# Patient Record
Sex: Female | Born: 1975 | Race: Black or African American | Hispanic: No | Smoking: Never smoker
Health system: Southern US, Community
[De-identification: ages and names within clinical notes are randomized; demographics above are authoritative.]

## PROBLEM LIST (undated history)

## (undated) DIAGNOSIS — Z87442 Personal history of urinary calculi: Secondary | ICD-10-CM

## (undated) DIAGNOSIS — F4321 Adjustment disorder with depressed mood: Secondary | ICD-10-CM

## (undated) DIAGNOSIS — I10 Essential (primary) hypertension: Secondary | ICD-10-CM

## (undated) DIAGNOSIS — F419 Anxiety disorder, unspecified: Secondary | ICD-10-CM

## (undated) DIAGNOSIS — N3001 Acute cystitis with hematuria: Secondary | ICD-10-CM

## (undated) HISTORY — PX: LIPOMA EXCISION: SHX5283

---

## 2008-02-24 HISTORY — PX: BREAST BIOPSY: SHX20

## 2019-03-20 ENCOUNTER — Other Ambulatory Visit: Payer: Self-pay | Admitting: Family Medicine

## 2019-03-20 DIAGNOSIS — Z1231 Encounter for screening mammogram for malignant neoplasm of breast: Secondary | ICD-10-CM

## 2019-04-27 ENCOUNTER — Ambulatory Visit
Admission: RE | Admit: 2019-04-27 | Discharge: 2019-04-27 | Disposition: A | Discharge status: Home / Self Care | Payer: Medicaid Other | Source: Ambulatory Visit | Attending: Family Medicine | Admitting: Family Medicine

## 2019-04-27 DIAGNOSIS — Z1231 Encounter for screening mammogram for malignant neoplasm of breast: Secondary | ICD-10-CM | POA: Insufficient documentation

## 2019-11-13 ENCOUNTER — Ambulatory Visit (HOSPITAL_COMMUNITY)
Admission: RE | Admit: 2019-11-13 | Discharge: 2019-11-13 | Disposition: A | Discharge status: Home / Self Care | Payer: Medicaid Other | Source: Ambulatory Visit | Attending: Pulmonary Disease | Admitting: Pulmonary Disease

## 2019-11-13 ENCOUNTER — Telehealth (HOSPITAL_COMMUNITY): Payer: Self-pay | Admitting: Nurse Practitioner

## 2019-11-13 ENCOUNTER — Emergency Department: Payer: Medicaid Other

## 2019-11-13 ENCOUNTER — Other Ambulatory Visit (HOSPITAL_COMMUNITY): Payer: Self-pay | Admitting: Nurse Practitioner

## 2019-11-13 ENCOUNTER — Other Ambulatory Visit: Payer: Self-pay

## 2019-11-13 ENCOUNTER — Encounter: Payer: Self-pay | Admitting: Emergency Medicine

## 2019-11-13 ENCOUNTER — Emergency Department
Admission: EM | Admit: 2019-11-13 | Discharge: 2019-11-13 | Disposition: A | Discharge status: Home / Self Care | Payer: Medicaid Other | Attending: Emergency Medicine | Admitting: Emergency Medicine

## 2019-11-13 DIAGNOSIS — U071 COVID-19: Secondary | ICD-10-CM

## 2019-11-13 DIAGNOSIS — R05 Cough: Secondary | ICD-10-CM | POA: Diagnosis present

## 2019-11-13 LAB — URINALYSIS, COMPLETE (UACMP) WITH MICROSCOPIC
Bacteria, UA: NONE SEEN
Bilirubin Urine: NEGATIVE
Glucose, UA: NEGATIVE mg/dL
Hgb urine dipstick: NEGATIVE
Ketones, ur: NEGATIVE mg/dL
Leukocytes,Ua: NEGATIVE
Nitrite: NEGATIVE
Protein, ur: 100 mg/dL — AB
Specific Gravity, Urine: 1.025 (ref 1.005–1.030)
pH: 5 (ref 5.0–8.0)

## 2019-11-13 LAB — CBC WITH DIFFERENTIAL/PLATELET
Abs Immature Granulocytes: 0.01 10*3/uL (ref 0.00–0.07)
Basophils Absolute: 0 10*3/uL (ref 0.0–0.1)
Basophils Relative: 0 %
Eosinophils Absolute: 0 10*3/uL (ref 0.0–0.5)
Eosinophils Relative: 0 %
HCT: 41 % (ref 36.0–46.0)
Hemoglobin: 13.4 g/dL (ref 12.0–15.0)
Immature Granulocytes: 0 %
Lymphocytes Relative: 21 %
Lymphs Abs: 0.8 10*3/uL (ref 0.7–4.0)
MCH: 28.9 pg (ref 26.0–34.0)
MCHC: 32.7 g/dL (ref 30.0–36.0)
MCV: 88.6 fL (ref 80.0–100.0)
Monocytes Absolute: 0.2 10*3/uL (ref 0.1–1.0)
Monocytes Relative: 6 %
Neutro Abs: 2.7 10*3/uL (ref 1.7–7.7)
Neutrophils Relative %: 73 %
Platelets: 109 10*3/uL — ABNORMAL LOW (ref 150–400)
RBC: 4.63 MIL/uL (ref 3.87–5.11)
RDW: 14.1 % (ref 11.5–15.5)
WBC: 3.7 10*3/uL — ABNORMAL LOW (ref 4.0–10.5)
nRBC: 0 % (ref 0.0–0.2)

## 2019-11-13 LAB — COMPREHENSIVE METABOLIC PANEL
ALT: 51 U/L — ABNORMAL HIGH (ref 0–44)
AST: 46 U/L — ABNORMAL HIGH (ref 15–41)
Albumin: 3.6 g/dL (ref 3.5–5.0)
Alkaline Phosphatase: 107 U/L (ref 38–126)
Anion gap: 13 (ref 5–15)
BUN: 11 mg/dL (ref 6–20)
CO2: 26 mmol/L (ref 22–32)
Calcium: 8.6 mg/dL — ABNORMAL LOW (ref 8.9–10.3)
Chloride: 97 mmol/L — ABNORMAL LOW (ref 98–111)
Creatinine, Ser: 0.73 mg/dL (ref 0.44–1.00)
GFR calc Af Amer: >60 mL/min (ref >60)
GFR calc non Af Amer: >60 mL/min (ref >60)
Glucose, Bld: 106 mg/dL — ABNORMAL HIGH (ref 70–99)
Potassium: 4.1 mmol/L (ref 3.5–5.1)
Sodium: 136 mmol/L (ref 135–145)
Total Bilirubin: 0.7 mg/dL (ref 0.3–1.2)
Total Protein: 7.3 g/dL (ref 6.5–8.1)

## 2019-11-13 LAB — POCT PREGNANCY, URINE: Preg Test, Ur: NEGATIVE

## 2019-11-13 LAB — LACTIC ACID, PLASMA: Lactic Acid, Venous: 0.9 mmol/L (ref 0.5–1.9)

## 2019-11-13 MED ORDER — ACETAMINOPHEN 325 MG PO TABS
ORAL_TABLET | ORAL | Status: AC
  Filled 2019-11-13: qty 2

## 2019-11-13 MED ORDER — DEXAMETHASONE SODIUM PHOSPHATE 10 MG/ML IJ SOLN
10.0000 mg | Freq: Once | INTRAMUSCULAR | Status: AC
  Administered 2019-11-13: 10 mg via INTRAVENOUS
  Filled 2019-11-13: qty 1

## 2019-11-13 MED ORDER — SODIUM CHLORIDE 0.9 % IV SOLN: INTRAVENOUS | Status: DC | PRN

## 2019-11-13 MED ORDER — SODIUM CHLORIDE 0.9 % IV BOLUS
1000.0000 mL | Freq: Once | INTRAVENOUS | Status: AC
  Administered 2019-11-13: 1000 mL via INTRAVENOUS

## 2019-11-13 MED ORDER — ACETAMINOPHEN 325 MG PO TABS
650.0000 mg | ORAL_TABLET | Freq: Once | ORAL | Status: AC | PRN
  Administered 2019-11-13: 650 mg via ORAL

## 2019-11-13 MED ORDER — PREDNISONE 10 MG PO TABS: 10.0000 mg | ORAL_TABLET | Freq: Every day | ORAL | 0 refills | Status: DC

## 2019-11-13 MED ORDER — METHYLPREDNISOLONE SODIUM SUCC 125 MG IJ SOLR: 125.0000 mg | Freq: Once | INTRAMUSCULAR | Status: DC | PRN

## 2019-11-13 MED ORDER — ALBUTEROL SULFATE HFA 108 (90 BASE) MCG/ACT IN AERS: 2.0000 | INHALATION_SPRAY | Freq: Once | RESPIRATORY_TRACT | Status: DC | PRN

## 2019-11-13 MED ORDER — DIPHENHYDRAMINE HCL 50 MG/ML IJ SOLN: 50.0000 mg | Freq: Once | INTRAMUSCULAR | Status: DC | PRN

## 2019-11-13 MED ORDER — GUAIFENESIN-CODEINE 100-10 MG/5ML PO SOLN: 5.0000 mL | Freq: Four times a day (QID) | ORAL | 0 refills | Status: DC | PRN

## 2019-11-13 MED ORDER — ONDANSETRON 4 MG PO TBDP
4.0000 mg | ORAL_TABLET | Freq: Once | ORAL | Status: AC
  Administered 2019-11-13: 4 mg via ORAL
  Filled 2019-11-13: qty 1

## 2019-11-13 MED ORDER — ONDANSETRON HCL 4 MG PO TABS: 4.0000 mg | ORAL_TABLET | Freq: Every day | ORAL | 0 refills | Status: DC | PRN

## 2019-11-13 MED ORDER — SODIUM CHLORIDE 0.9 % IV SOLN
1200.0000 mg | Freq: Once | INTRAVENOUS | Status: AC
  Administered 2019-11-13: 1200 mg via INTRAVENOUS

## 2019-11-13 MED ORDER — EPINEPHRINE 0.3 MG/0.3ML IJ SOAJ: 0.3000 mg | Freq: Once | INTRAMUSCULAR | Status: DC | PRN

## 2019-11-13 MED ORDER — FAMOTIDINE IN NACL 20-0.9 MG/50ML-% IV SOLN: 20.0000 mg | Freq: Once | INTRAVENOUS | Status: DC | PRN

## 2019-11-13 MED ORDER — KETOROLAC TROMETHAMINE 30 MG/ML IJ SOLN
30.0000 mg | Freq: Once | INTRAMUSCULAR | Status: AC
  Administered 2019-11-13: 30 mg via INTRAVENOUS
  Filled 2019-11-13: qty 1

## 2019-11-13 NOTE — Progress Notes (Signed)
  Diagnosis: COVID-19  Physician: Dr. Wright   Procedure: Covid Infusion Clinic Med: casirivimab\imdevimab infusion - Provided patient with casirivimab\imdevimab fact sheet for patients, parents and caregivers prior to infusion.  Complications: No immediate complications noted.  Discharge: Discharged home   Kristen Rupert  Bush 11/13/2019   

## 2019-11-13 NOTE — Discharge Instructions (Signed)

## 2019-11-13 NOTE — Progress Notes (Signed)
I connected by phone with Kristen Bush on 11/13/2019 at 3:47 PM to discuss the potential use of an new treatment for mild to moderate COVID-19 viral infection in non-hospitalized patients.  This patient is a 44 y.o. female that meets the FDA criteria for Emergency Use Authorization of casirivimab\imdevimab.  Has a (+) direct SARS-CoV-2 viral test result  Has mild or moderate COVID-19   Is ? 44 years of age and weighs ? 40 kg  Is NOT hospitalized due to COVID-19  Is NOT requiring oxygen therapy or requiring an increase in baseline oxygen flow rate due to COVID-19  Is within 10 days of symptom onset  Has at least one of the high risk factor(s) for progression to severe COVID-19 and/or hospitalization as defined in EUA.  Specific high risk criteria : BMI > 25   Sx onset 9/12.    I have spoken and communicated the following to the patient or parent/caregiver:  1. FDA has authorized the emergency use of casirivimab\imdevimab for the treatment of mild to moderate COVID-19 in adults and pediatric patients with positive results of direct SARS-CoV-2 viral testing who are 16 years of age and older weighing at least 40 kg, and who are at high risk for progressing to severe COVID-19 and/or hospitalization.  2. The significant known and potential risks and benefits of casirivimab\imdevimab, and the extent to which such potential risks and benefits are unknown.  3. Information on available alternative treatments and the risks and benefits of those alternatives, including clinical trials.  4. Patients treated with casirivimab\imdevimab should continue to self-isolate and use infection control measures (e.g., wear mask, isolate, social distance, avoid sharing personal items, clean and disinfect "high touch" surfaces, and frequent handwashing) according to CDC guidelines.   5. The patient or parent/caregiver has the option to accept or refuse casirivimab\imdevimab .  After reviewing this information  with the patient, The patient agreed to proceed with receiving casirivimab\imdevimab infusion and will be provided a copy of the Fact sheet prior to receiving the infusion.Beckey Rutter, [place], AGNP-C [phone] ([place])

## 2019-11-13 NOTE — Telephone Encounter (Signed)
Called to Discuss with patient about Covid symptoms and the use of regeneron, a monoclonal antibody infusion for those with mild to moderate Covid symptoms and at a high risk of hospitalization.     Pt is qualified for this infusion at the [place] infusion center due to co-morbid conditions and/or a member of an at-risk group.     Unable to reach pt. Left message to return call. Sent text message.   Beckey Rutter, [place], AGNP-C [phone] ([place])

## 2019-11-13 NOTE — ED Triage Notes (Signed)
Patient to ER for c/o cough, chills, diarrhea (approx 2-3 per day), body aches, vomiting last night (3 episodes). Patient reports testing positive for covid last Wednesday at [facility] ([place]).

## 2019-11-13 NOTE — ED Provider Notes (Signed)
[facility] Emergency Department Provider Note  Time seen: 1:16 PM  I have reviewed the triage vital signs and the nursing notes.   HISTORY  Chief Complaint Cough and Chills   HPI Kristen Bush is a 44 y.o. female with a past medical history of hypertension presents to the emergency department for cough nausea vomiting shortness of breath and body aches.  According to the patient 1 week ago she developed symptoms of body aches which have progressively worsened the cough and now nausea and vomiting along with body aches fever as high as 103 at home.  Patient states on the 15th she was tested for Covid and resulted positive.  Patient denies any chest pain but does state diffuse body aches.  States cough with mild shortness of breath.  Patient currently febrile to 103.  History reviewed. No pertinent past medical history.  There are no problems to display for this patient.   Past Surgical History:  Procedure Laterality Date  . BREAST BIOPSY Left 2010   Neg    Prior to Admission medications   Not on File    Allergies  Allergen Reactions  . Erythromycin Swelling    Itching    Family History  Problem Relation Age of Onset  . Breast cancer Maternal Aunt     Social History Social History   Tobacco Use  . Smoking status: Never Smoker  . Smokeless tobacco: Never Used  Substance Use Topics  . Alcohol use: Never  . Drug use: Not on file    Review of Systems Constitutional: Positive for fever and generalized weakness Cardiovascular: Negative for chest pain. Respiratory: Mild shortness of breath, occasional cough. Gastrointestinal: Negative for abdominal pain.  Positive for nausea vomiting. Musculoskeletal: Negative for musculoskeletal complaints Neurological: Negative for headache All other ROS negative  ____________________________________________   PHYSICAL EXAM:  VITAL SIGNS: ED Triage Vitals  Enc Vitals Group     BP 11/13/19 1221  119/74     Pulse Rate 11/13/19 1221 100     Resp 11/13/19 1221 20     Temp 11/13/19 1221 (!) 103.1 F (39.5 C)     Temp Source 11/13/19 1221 Oral     SpO2 11/13/19 1221 96 %     Weight 11/13/19 1224 215 lb (97.5 kg)     Height 11/13/19 1224 5\' 4"  (1.626 m)     Head Circumference --      Peak Flow --      Pain Score 11/13/19 1223 8     Pain Loc --      Pain Edu? --      Excl. in [place]? --    Constitutional: Alert and oriented. Well appearing and in no distress. Eyes: Normal exam ENT      Head: Normocephalic and atraumatic.      Mouth/Throat: Mucous membranes are moist. Cardiovascular: Normal rate, regular rhythm. Respiratory: Normal respiratory effort without tachypnea nor retractions. Breath sounds are clear.  Occasional cough during exam. Gastrointestinal: Soft and nontender. No distention.   Musculoskeletal: Nontender with normal range of motion in all extremities.  Neurologic:  Normal speech and language. No gross focal neurologic deficits  Skin:  Skin is warm, dry and intact.  Psychiatric: Mood and affect are normal.      RADIOLOGY  Chest x-ray shows mild multifocal pneumonia.  ____________________________________________   INITIAL IMPRESSION / ASSESSMENT AND PLAN / ED COURSE  Pertinent labs & imaging results that were available during my care of the patient were reviewed  by me and considered in my medical decision making (see chart for details).   Patient presents emergency department with 1 week of Covid symptoms, tested positive for Covid 5 days ago.  Patient currently febrile to 103.1 mildly tachycardic.  Reassuringly has a 96% room air saturation, overall reassuring physical exam.  We will check labs, treat symptoms with fluids, Zofran and Decadron.  I believe given the patient's elevated BMI and history of hypertension she would be a good candidate for monoclonal antibody infusions.  Patient agreeable to plan of care.  We will reassess after medications.  Chest  x-ray consistent multifocal pneumonia consistent with the patient's known COVID-19 status.  Patient's lab work overall reassuring.  Temperature coming down with medication.  Patient feeling much better after medications.  I spoke to the infusion clinic and they would likely be able to work the patient into the clinic tomorrow for monoclonal antibodies.  We will discharge with my typical Covid return precautions.  We will place the patient on steroids, Zofran and cough medication.  Kristen Bush was evaluated in Emergency Department on 11/13/2019 for the symptoms described in the history of present illness. She was evaluated in the context of the global COVID-19 pandemic, which necessitated consideration that the patient might be at risk for infection with the SARS-CoV-2 virus that causes COVID-19. Institutional protocols and algorithms that pertain to the evaluation of patients at risk for COVID-19 are in a state of rapid change based on information released by regulatory bodies including the CDC and federal and state organizations. These policies and algorithms were followed during the patient's care in the ED.  ____________________________________________   FINAL CLINICAL IMPRESSION(S) / ED DIAGNOSES  COVID-19 Nausea vomiting Cough   Harvest Dark, MD 11/13/19 1520

## 2020-04-03 ENCOUNTER — Other Ambulatory Visit: Payer: Self-pay | Admitting: Family Medicine

## 2020-04-03 DIAGNOSIS — Z1231 Encounter for screening mammogram for malignant neoplasm of breast: Secondary | ICD-10-CM

## 2020-04-30 ENCOUNTER — Ambulatory Visit
Admission: RE | Admit: 2020-04-30 | Discharge: 2020-04-30 | Disposition: A | Discharge status: Home / Self Care | Payer: Medicaid Other | Source: Ambulatory Visit | Attending: Family Medicine | Admitting: Family Medicine

## 2020-04-30 ENCOUNTER — Other Ambulatory Visit: Payer: Self-pay

## 2020-04-30 DIAGNOSIS — Z1231 Encounter for screening mammogram for malignant neoplasm of breast: Secondary | ICD-10-CM | POA: Insufficient documentation

## 2020-10-22 ENCOUNTER — Ambulatory Visit: Payer: Self-pay | Admitting: General Surgery

## 2020-10-22 NOTE — H&P (Signed)
PATIENT PROFILE: Kristen Bush is a 45 y.o. female who presents to the Clinic for evaluation of soft tissue mass of upper back.  PCP:  Melonie Florida, MD  HISTORY OF PRESENT ILLNESS: Kristen Bush reports having a soft tissue mass in the upper back.  He feels pressure on the area.  Pain localized to the upper back.  No pain radiation.  Pain aggravated by applying pressure.  No alleviating factors.  The patient reported that he has been growing in size.  Denies any previous infection.   PROBLEM LIST: Upper back soft tissue mass  GENERAL REVIEW OF SYSTEMS:   General ROS: negative for - chills, fatigue, fever, weight gain or weight loss Allergy and Immunology ROS: negative for - hives  Hematological and Lymphatic ROS: negative for - bleeding problems or bruising, negative for palpable nodes Endocrine ROS: negative for - heat or cold intolerance, hair changes Respiratory ROS: negative for - cough, shortness of breath or wheezing Cardiovascular ROS: no chest pain or palpitations GI ROS: negative for nausea, vomiting, abdominal pain, diarrhea, constipation Musculoskeletal ROS: negative for - joint swelling or muscle pain Neurological ROS: negative for - confusion, syncope Dermatological ROS: negative for pruritus and rash Psychiatric: negative for anxiety, depression, difficulty sleeping and memory loss  MEDICATIONS: Current Outpatient Medications  Medication Sig Dispense Refill   beta-carotene,A,-vits C,E/mins (OCUVITE ORAL) Take by mouth     cetirizine (ZYRTEC) 10 MG tablet      clobetasoL (TEMOVATE) 0.05 % ointment APPLY TOPICALLY 2 TIMES DAILY NIGHTLY X 4 WEEKS, THEN EVERY OTHER NIGHT WEEK FOR 4 WEEKS, AND THEN TWICE WEEKLY. 30 g 0   docosahexaenoic acid/epa (FISH OIL ORAL) Take by mouth     hydrOXYzine pamoate (VISTARIL) 25 MG capsule Take 1 capsule (25 mg total) by mouth nightly as needed for Itching or Anxiety for up to 360 days 90 capsule 3   lisinopriL-hydrochlorothiazide  (ZESTORETIC) 20-12.5 mg tablet TAKE 1 TABLET BY MOUTH DAILY FOR HIGH BLOOD PRESSURE     OLIVE LEAF EXTRACT ORAL Take by mouth     sertraline (ZOLOFT) 25 MG tablet TAKE 1 TABLET BY MOUTH ONCE A DAY FOR 1 WEEK THEN INCREASE TO 2 TABLETS DAILY FOR DEPRESSION     No current facility-administered medications for this visit.    ALLERGIES: Erythromycin  PAST MEDICAL HISTORY: Past Medical History:  Diagnosis Date   Hypertension     PAST SURGICAL HISTORY: Past Surgical History:  Procedure Laterality Date   lypoma - breast Left      FAMILY HISTORY: Family History  Problem Relation Age of Onset   Obesity Father    Diabetes Sister    High blood pressure (Hypertension) Sister    Osteoarthritis Sister      SOCIAL HISTORY: Social History   Socioeconomic History   Marital status: Single  Tobacco Use   Smoking status: Never Smoker   Smokeless tobacco: Never Used  Scientific laboratory technician Use: Never used  Substance and Sexual Activity   Alcohol use: Not Currently   Drug use: Not Currently    PHYSICAL EXAM: Vitals:   10/22/20 1115  BP: 126/84  Pulse: 62   Body mass index is 38.19 kg/m. Weight: (!) 102.5 kg (225 lb 15.5 oz)   GENERAL: Alert, active, oriented x3  HEENT: Pupils equal reactive to light. Extraocular movements are intact. Sclera clear. Palpebral conjunctiva normal red color.Pharynx clear.  NECK: Supple with no palpable mass and no adenopathy.  LUNGS: Sound clear with no rales  rhonchi or wheezes.  HEART: Regular rhythm S1 and S2 without murmur.  ABDOMEN: Soft and depressible, nontender with no palpable mass, no hepatomegaly.   BACK: There is a soft tissue mass in the upper back, left.  The soft tissue mass is rubbery, mobile, no erythema.  It measures '6 9 4 '$ cm.  EXTREMITIES: Well-developed well-nourished symmetrical with no dependent edema.  NEUROLOGICAL: Awake alert oriented, facial expression symmetrical, moving all extremities.  REVIEW OF DATA: I have  reviewed the following data today: No visits with results within 3 Month(s) from this visit.  Latest known visit with results is:  Office Visit on 05/03/2019  Component Date Value   Diagnostic Interpretation 05/03/2019 Comment    Specimen adequacy: - Lab* 05/03/2019 Comment    Clinician provided ICD10* 05/03/2019 Comment    PERFORMED BY: - [facility] 05/03/2019 Comment    . - [facility] 05/03/2019 .    Note: - [facility] 05/03/2019 Comment    Test Method MT21 - [facility]* 05/03/2019 Comment    HPV APTIMA - [facility] 05/03/2019 Negative      ASSESSMENT: Kristen Bush is a 45 y.o. female presenting for consultation for upper back soft tissue mass. The patient has a mass on the left upper back that is causing some pain and discomfort on pressure. Patient oriented about the diagnosis of soft tissue mass, possible benign, not a malignant lesion but if it is causing symptoms, excision can be considered. Patient oriented about the procedure, benefits and risk.   Mass on back [R22.2]  PLAN: Excision of soft tissue mass of upper back [id number]) Avoid taking aspirin 5 days before the procedure.  Contact us if you have any concern.   Patient verbalized understanding, all questions were answered, and were agreeable with the plan outlined above.   Herbert Pun, MD  Electronically signed by Herbert Pun, MD

## 2020-10-22 NOTE — H&P (View-Only) (Signed)
PATIENT PROFILE: CENDY SAGEL is a 45 y.o. female who presents to the Clinic for evaluation of soft tissue mass of upper back.  PCP:  Melonie Florida, MD  HISTORY OF PRESENT ILLNESS: Ms. Stankovic reports having a soft tissue mass in the upper back.  He feels pressure on the area.  Pain localized to the upper back.  No pain radiation.  Pain aggravated by applying pressure.  No alleviating factors.  The patient reported that he has been growing in size.  Denies any previous infection.   PROBLEM LIST: Upper back soft tissue mass  GENERAL REVIEW OF SYSTEMS:   General ROS: negative for - chills, fatigue, fever, weight gain or weight loss Allergy and Immunology ROS: negative for - hives  Hematological and Lymphatic ROS: negative for - bleeding problems or bruising, negative for palpable nodes Endocrine ROS: negative for - heat or cold intolerance, hair changes Respiratory ROS: negative for - cough, shortness of breath or wheezing Cardiovascular ROS: no chest pain or palpitations GI ROS: negative for nausea, vomiting, abdominal pain, diarrhea, constipation Musculoskeletal ROS: negative for - joint swelling or muscle pain Neurological ROS: negative for - confusion, syncope Dermatological ROS: negative for pruritus and rash Psychiatric: negative for anxiety, depression, difficulty sleeping and memory loss  MEDICATIONS: Current Outpatient Medications  Medication Sig Dispense Refill   beta-carotene,A,-vits C,E/mins (OCUVITE ORAL) Take by mouth     cetirizine (ZYRTEC) 10 MG tablet      clobetasoL (TEMOVATE) 0.05 % ointment APPLY TOPICALLY 2 TIMES DAILY NIGHTLY X 4 WEEKS, THEN EVERY OTHER NIGHT WEEK FOR 4 WEEKS, AND THEN TWICE WEEKLY. 30 g 0   docosahexaenoic acid/epa (FISH OIL ORAL) Take by mouth     hydrOXYzine pamoate (VISTARIL) 25 MG capsule Take 1 capsule (25 mg total) by mouth nightly as needed for Itching or Anxiety for up to 360 days 90 capsule 3   lisinopriL-hydrochlorothiazide  (ZESTORETIC) 20-12.5 mg tablet TAKE 1 TABLET BY MOUTH DAILY FOR HIGH BLOOD PRESSURE     OLIVE LEAF EXTRACT ORAL Take by mouth     sertraline (ZOLOFT) 25 MG tablet TAKE 1 TABLET BY MOUTH ONCE A DAY FOR 1 WEEK THEN INCREASE TO 2 TABLETS DAILY FOR DEPRESSION     No current facility-administered medications for this visit.    ALLERGIES: Erythromycin  PAST MEDICAL HISTORY: Past Medical History:  Diagnosis Date   Hypertension     PAST SURGICAL HISTORY: Past Surgical History:  Procedure Laterality Date   lypoma - breast Left      FAMILY HISTORY: Family History  Problem Relation Age of Onset   Obesity Father    Diabetes Sister    High blood pressure (Hypertension) Sister    Osteoarthritis Sister      SOCIAL HISTORY: Social History   Socioeconomic History   Marital status: Single  Tobacco Use   Smoking status: Never Smoker   Smokeless tobacco: Never Used  Scientific laboratory technician Use: Never used  Substance and Sexual Activity   Alcohol use: Not Currently   Drug use: Not Currently    PHYSICAL EXAM: Vitals:   10/22/20 1115  BP: 126/84  Pulse: 62   Body mass index is 38.19 kg/m. Weight: (!) 102.5 kg (225 lb 15.5 oz)   GENERAL: Alert, active, oriented x3  HEENT: Pupils equal reactive to light. Extraocular movements are intact. Sclera clear. Palpebral conjunctiva normal red color.Pharynx clear.  NECK: Supple with no palpable mass and no adenopathy.  LUNGS: Sound clear with no rales  rhonchi or wheezes.  HEART: Regular rhythm S1 and S2 without murmur.  ABDOMEN: Soft and depressible, nontender with no palpable mass, no hepatomegaly.   BACK: There is a soft tissue mass in the upper back, left.  The soft tissue mass is rubbery, mobile, no erythema.  It measures '6 9 4 '$ cm.  EXTREMITIES: Well-developed well-nourished symmetrical with no dependent edema.  NEUROLOGICAL: Awake alert oriented, facial expression symmetrical, moving all extremities.  REVIEW OF DATA: I have  reviewed the following data today: No visits with results within 3 Month(s) from this visit.  Latest known visit with results is:  Office Visit on 05/03/2019  Component Date Value   Diagnostic Interpretation 05/03/2019 Comment    Specimen adequacy: - Lab* 05/03/2019 Comment    Clinician provided ICD10* 05/03/2019 Comment    PERFORMED BY: - [facility] 05/03/2019 Comment    . - [facility] 05/03/2019 .    Note: - [facility] 05/03/2019 Comment    Test Method MT21 - [facility] 05/03/2019 Comment    HPV APTIMA - [facility] 05/03/2019 Negative      ASSESSMENT: Ms. Basa is a 45 y.o. female presenting for consultation for upper back soft tissue mass. The patient has a mass on the left upper back that is causing some pain and discomfort on pressure. Patient oriented about the diagnosis of soft tissue mass, possible benign, not a malignant lesion but if it is causing symptoms, excision can be considered. Patient oriented about the procedure, benefits and risk.   Mass on back [R22.2]  PLAN: Excision of soft tissue mass of upper back [id number]) Avoid taking aspirin 5 days before the procedure.  Contact us if you have any concern.   Patient verbalized understanding, all questions were answered, and were agreeable with the plan outlined above.   Herbert Pun, MD  Electronically signed by Herbert Pun, MD

## 2020-10-25 ENCOUNTER — Encounter
Admission: RE | Admit: 2020-10-25 | Discharge: 2020-10-25 | Disposition: A | Discharge status: Home / Self Care | Payer: Medicaid Other | Source: Ambulatory Visit | Attending: General Surgery | Admitting: General Surgery

## 2020-10-25 ENCOUNTER — Other Ambulatory Visit: Payer: Self-pay

## 2020-10-25 HISTORY — DX: Adjustment disorder with depressed mood: F43.21

## 2020-10-25 HISTORY — DX: Essential (primary) hypertension: I10

## 2020-10-25 NOTE — Patient Instructions (Addendum)
Your procedure is scheduled on: 11/01/20 Report to [place]. To find out your arrival time please call [phone] between 1PM - 3PM on 10/31/20.  Remember: Instructions that are not followed completely may result in serious medical risk, up to and including death, or upon the discretion of your surgeon and anesthesiologist your surgery may need to be rescheduled.     _X__ 1. Do not eat food after midnight the night before your procedure.                 No gum chewing or hard candies.   __X__2.  On the morning of surgery brush your teeth with toothpaste and water, you                 may rinse your mouth with mouthwash if you wish.  Do not swallow any              toothpaste of mouthwash.     _X__ 3.  No Alcohol for 24 hours before or after surgery.   _X__ 4.  Do Not Smoke or use e-cigarettes For 24 Hours Prior to Your Surgery.                 Do not use any chewable tobacco products for at least 6 hours prior to                 surgery.  ____  5.  Bring all medications with you on the day of surgery if instructed.   __X__  6.  Notify your doctor if there is any change in your medical condition      (cold, fever, infections).     Do not wear jewelry, make-up, hairpins, clips or nail polish. Do not wear lotions, powders, or perfumes.  Do not shave body hair 48 hours prior to surgery. Men may shave face and neck. Do not bring valuables to the hospital.    [facility] is not responsible for any belongings or valuables.  Contacts, dentures/partials or body piercings may not be worn into surgery. Bring a case for your contacts, glasses or hearing aids, a denture cup will be supplied. Leave your suitcase in the car. After surgery it may be brought to your room. For patients admitted to the hospital, discharge time is determined by your treatment team.   Patients discharged the day of surgery will not be allowed to drive  home.   Please read over the following fact sheets that you were given:   CHG soap  __X__ Take these medicines the morning of surgery with A SIP OF WATER:    1. none  2.   3.   4.  5.  6.  ____ Fleet Enema (as directed)   __X__ Use CHG Soap/SAGE wipes as directed  ____ Use inhalers on the day of surgery  ____ Stop metformin/Janumet/Farxiga 2 days prior to surgery    ____ Take 1/2 of usual insulin dose the night before surgery. No insulin the morning          of surgery.   ____ Stop Blood Thinners Coumadin/Plavix/Xarelto/Pleta/Pradaxa/Eliquis/Effient/Aspirin  on   Or contact your Surgeon, Cardiologist or Medical Doctor regarding  ability to stop your blood thinners  __X__ Stop Anti-inflammatories 7 days before surgery such as Advil, Ibuprofen, Motrin,  BC or Goodies Powder, Naprosyn, Naproxen, Aleve, Aspirin    __X__ Stop all herbal and vitamin supplements, fish oil or vitamin E  until after surgery 7 days before surgery   ____ Bring C-Pap to the hospital.

## 2020-10-29 ENCOUNTER — Encounter: Payer: Self-pay | Admitting: Urgent Care

## 2020-10-29 ENCOUNTER — Other Ambulatory Visit: Payer: Self-pay

## 2020-10-29 ENCOUNTER — Encounter
Admission: RE | Admit: 2020-10-29 | Discharge: 2020-10-29 | Disposition: A | Discharge status: Home / Self Care | Payer: Medicaid Other | Source: Ambulatory Visit | Attending: General Surgery | Admitting: General Surgery

## 2020-10-29 DIAGNOSIS — I10 Essential (primary) hypertension: Secondary | ICD-10-CM | POA: Diagnosis not present

## 2020-10-29 DIAGNOSIS — Z01818 Encounter for other preprocedural examination: Secondary | ICD-10-CM | POA: Insufficient documentation

## 2020-10-29 DIAGNOSIS — Z01810 Encounter for preprocedural cardiovascular examination: Secondary | ICD-10-CM

## 2020-10-29 LAB — POTASSIUM: Potassium: 4.1 mmol/L (ref 3.5–5.1)

## 2020-11-01 ENCOUNTER — Other Ambulatory Visit: Payer: Self-pay

## 2020-11-01 ENCOUNTER — Ambulatory Visit: Payer: Medicaid Other | Admitting: Anesthesiology

## 2020-11-01 ENCOUNTER — Encounter: Payer: Self-pay | Admitting: General Surgery

## 2020-11-01 ENCOUNTER — Encounter
Admission: RE | Disposition: A | Discharge status: Home / Self Care | Payer: Self-pay | Source: Ambulatory Visit | Attending: General Surgery

## 2020-11-01 ENCOUNTER — Ambulatory Visit
Admission: RE | Admit: 2020-11-01 | Discharge: 2020-11-01 | Disposition: A | Discharge status: Home / Self Care | Payer: Medicaid Other | Source: Ambulatory Visit | Attending: General Surgery | Admitting: General Surgery

## 2020-11-01 DIAGNOSIS — Z79899 Other long term (current) drug therapy: Secondary | ICD-10-CM | POA: Diagnosis not present

## 2020-11-01 DIAGNOSIS — D171 Benign lipomatous neoplasm of skin and subcutaneous tissue of trunk: Secondary | ICD-10-CM | POA: Diagnosis not present

## 2020-11-01 DIAGNOSIS — Z881 Allergy status to other antibiotic agents status: Secondary | ICD-10-CM | POA: Insufficient documentation

## 2020-11-01 DIAGNOSIS — R222 Localized swelling, mass and lump, trunk: Secondary | ICD-10-CM | POA: Diagnosis present

## 2020-11-01 HISTORY — PX: LIPOMA EXCISION: SHX5283

## 2020-11-01 SURGERY — EXCISION LIPOMA
Anesthesia: General | Site: Back

## 2020-11-01 MED ORDER — CEFAZOLIN SODIUM-DEXTROSE 2-4 GM/100ML-% IV SOLN
INTRAVENOUS | Status: AC
  Filled 2020-11-01: qty 100

## 2020-11-01 MED ORDER — PROPOFOL 10 MG/ML IV BOLUS
INTRAVENOUS | Status: DC | PRN
  Administered 2020-11-01: 40 mg via INTRAVENOUS

## 2020-11-01 MED ORDER — ORAL CARE MOUTH RINSE: 15.0000 mL | Freq: Once | OROMUCOSAL | Status: AC

## 2020-11-01 MED ORDER — CHLORHEXIDINE GLUCONATE 0.12 % MT SOLN: 15.0000 mL | Freq: Once | OROMUCOSAL | Status: AC

## 2020-11-01 MED ORDER — FENTANYL CITRATE (PF) 100 MCG/2ML IJ SOLN
INTRAMUSCULAR | Status: DC | PRN
  Administered 2020-11-01: 50 ug via INTRAVENOUS

## 2020-11-01 MED ORDER — BUPIVACAINE-EPINEPHRINE 0.5% -1:200000 IJ SOLN
INTRAMUSCULAR | Status: DC | PRN
  Administered 2020-11-01: 30 mL

## 2020-11-01 MED ORDER — OXYCODONE HCL 5 MG/5ML PO SOLN
5.0000 mg | Freq: Once | ORAL | Status: DC | PRN
Start: 2020-11-01 — End: 2020-11-01

## 2020-11-01 MED ORDER — PROPOFOL 500 MG/50ML IV EMUL
INTRAVENOUS | Status: DC | PRN
  Administered 2020-11-01: 200 ug/kg/min via INTRAVENOUS

## 2020-11-01 MED ORDER — MEPERIDINE HCL 25 MG/ML IJ SOLN: 6.2500 mg | INTRAMUSCULAR | Status: DC | PRN

## 2020-11-01 MED ORDER — MIDAZOLAM HCL 2 MG/2ML IJ SOLN
INTRAMUSCULAR | Status: DC | PRN
  Administered 2020-11-01: 2 mg via INTRAVENOUS

## 2020-11-01 MED ORDER — FENTANYL CITRATE (PF) 100 MCG/2ML IJ SOLN
INTRAMUSCULAR | Status: AC
  Filled 2020-11-01: qty 2

## 2020-11-01 MED ORDER — HYDROCODONE-ACETAMINOPHEN 5-325 MG PO TABS: 1.0000 | ORAL_TABLET | ORAL | 0 refills | Status: AC | PRN

## 2020-11-01 MED ORDER — 0.9 % SODIUM CHLORIDE (POUR BTL) OPTIME
TOPICAL | Status: DC | PRN
  Administered 2020-11-01: 500 mL

## 2020-11-01 MED ORDER — CEFAZOLIN SODIUM-DEXTROSE 2-4 GM/100ML-% IV SOLN
2.0000 g | INTRAVENOUS | Status: AC
  Administered 2020-11-01: 2 g via INTRAVENOUS

## 2020-11-01 MED ORDER — PROPOFOL 10 MG/ML IV BOLUS
INTRAVENOUS | Status: AC
  Filled 2020-11-01: qty 20

## 2020-11-01 MED ORDER — MIDAZOLAM HCL 2 MG/2ML IJ SOLN
INTRAMUSCULAR | Status: AC
  Filled 2020-11-01: qty 2

## 2020-11-01 MED ORDER — BUPIVACAINE-EPINEPHRINE (PF) 0.5% -1:200000 IJ SOLN
INTRAMUSCULAR | Status: AC
  Filled 2020-11-01: qty 30

## 2020-11-01 MED ORDER — OXYCODONE HCL 5 MG PO TABS
5.0000 mg | ORAL_TABLET | Freq: Once | ORAL | Status: DC | PRN
Start: 2020-11-01 — End: 2020-11-01

## 2020-11-01 MED ORDER — LACTATED RINGERS IV SOLN: INTRAVENOUS | Status: DC

## 2020-11-01 MED ORDER — ONDANSETRON HCL 4 MG/2ML IJ SOLN
INTRAMUSCULAR | Status: DC | PRN
  Administered 2020-11-01: 4 mg via INTRAVENOUS

## 2020-11-01 MED ORDER — FAMOTIDINE 20 MG PO TABS
ORAL_TABLET | ORAL | Status: AC
  Administered 2020-11-01: 20 mg via ORAL
  Filled 2020-11-01: qty 1

## 2020-11-01 MED ORDER — FENTANYL CITRATE (PF) 100 MCG/2ML IJ SOLN
25.0000 ug | INTRAMUSCULAR | Status: DC | PRN
  Administered 2020-11-01: 25 ug via INTRAVENOUS

## 2020-11-01 MED ORDER — PROMETHAZINE HCL 25 MG/ML IJ SOLN: 6.2500 mg | INTRAMUSCULAR | Status: DC | PRN

## 2020-11-01 MED ORDER — CHLORHEXIDINE GLUCONATE 0.12 % MT SOLN
OROMUCOSAL | Status: AC
  Administered 2020-11-01: 15 mL via OROMUCOSAL
  Filled 2020-11-01: qty 15

## 2020-11-01 MED ORDER — FAMOTIDINE 20 MG PO TABS: 20.0000 mg | ORAL_TABLET | Freq: Once | ORAL | Status: AC

## 2020-11-01 SURGICAL SUPPLY — 34 items
BLADE SURG 15 STRL LF DISP TIS (BLADE) ×1 IMPLANT
BLADE SURG 15 STRL SS (BLADE) ×1
CHLORAPREP W/TINT 26 (MISCELLANEOUS) ×2 IMPLANT
CNTNR SPEC 2.5X3XGRAD LEK (MISCELLANEOUS)
CONT SPEC 4OZ STER OR WHT (MISCELLANEOUS)
CONTAINER SPEC 2.5X3XGRAD LEK (MISCELLANEOUS) IMPLANT
DERMABOND ADVANCED (GAUZE/BANDAGES/DRESSINGS) ×1
DERMABOND ADVANCED .7 DNX12 (GAUZE/BANDAGES/DRESSINGS) ×1 IMPLANT
DRAPE LAPAROTOMY 100X77 ABD (DRAPES) ×2 IMPLANT
DRAPE UNDER BUTTOCK W/FLU (DRAPES) IMPLANT
ELECT REM PT RETURN 9FT ADLT (ELECTROSURGICAL) ×2
ELECTRODE REM PT RTRN 9FT ADLT (ELECTROSURGICAL) ×1 IMPLANT
FORMALIN 16 OZ.480ML RBBP-0480 (MISCELLANEOUS) ×2 IMPLANT
GAUZE 4X4 16PLY ~~LOC~~+RFID DBL (SPONGE) ×2 IMPLANT
GLOVE SURG ENC MOIS LTX SZ6.5 (GLOVE) ×2 IMPLANT
GLOVE SURG UNDER POLY LF SZ6.5 (GLOVE) ×2 IMPLANT
KIT TURNOVER KIT A (KITS) ×2 IMPLANT
LABEL OR SOLS (LABEL) ×2 IMPLANT
MANIFOLD NEPTUNE II (INSTRUMENTS) ×2 IMPLANT
MARGIN MAP 10MM (MISCELLANEOUS) ×2 IMPLANT
NEEDLE HYPO 25X1 1.5 SAFETY (NEEDLE) ×2 IMPLANT
NS IRRIG 500ML POUR BTL (IV SOLUTION) ×2 IMPLANT
PACK BASIN MINOR ARMC (MISCELLANEOUS) ×2 IMPLANT
SUT ETHILON 3-0 (SUTURE) IMPLANT
SUT MNCRL 4-0 (SUTURE) ×1
SUT MNCRL 4-0 27XMFL (SUTURE) ×1
SUT VIC AB 2-0 SH 27 (SUTURE)
SUT VIC AB 2-0 SH 27XBRD (SUTURE) IMPLANT
SUT VIC AB 3-0 PS2 18 (SUTURE) ×2 IMPLANT
SUT VIC AB 3-0 SH 27 (SUTURE) ×1
SUT VIC AB 3-0 SH 27X BRD (SUTURE) ×1 IMPLANT
SUTURE MNCRL 4-0 27XMF (SUTURE) ×1 IMPLANT
SYR 10ML LL (SYRINGE) ×2 IMPLANT
WATER STERILE IRR 500ML POUR (IV SOLUTION) IMPLANT

## 2020-11-01 NOTE — Transfer of Care (Signed)
Immediate Anesthesia Transfer of Care Note  Patient: Kristen Bush  Procedure(s) Performed: EXCISION LIPOMA (Back)  Patient Location: PACU  Anesthesia Type:MAC  Level of Consciousness: sedated  Airway & Oxygen Therapy: Patient Spontanous Breathing and Patient connected to face mask oxygen  Post-op Assessment: Report given to RN and Post -op Vital signs reviewed and stable  Post vital signs: Reviewed and stable  Last Vitals:  Vitals Value Taken Time  BP 116/64 11/01/20 1107  Temp    Pulse 69 11/01/20 1109  Resp 12 11/01/20 1109  SpO2 100 % 11/01/20 1109  Vitals shown include unvalidated device data.  Last Pain:  Vitals:   11/01/20 0852  TempSrc: Temporal  PainSc: 0-No pain         Complications: No notable events documented.

## 2020-11-01 NOTE — Op Note (Signed)
Operation Report  Pre Operative Diagnosis: Back soft tissue mass  Post operative diagnosis: Back soft tissue mass  Anesthesia: Sedation and Local   Surgeon: Dr. Windell Moment   Indication: This 45 y.o. year old female with a soft tissue mass that is causing discomfort on pressure and is increasing in size.    Description of procedure: after orienting patient about the procedure steps and benefits and patient agreed to proceed. Time out was done identifying correct patient and location of procedure. Local anesthesia was infiltrated around the palpable lesion. With a blade #15, an elliptical incision was made using the skin lines. Sharp dissection was carried down and lesion was excised including dermal tissue and I went through the fascia since the mass was deeper than the fascia. The mass measured 6 cm with multilobular fat tissue. Deep dermal stitches were done with vicryl 2-0 to repair the laceration and skin closed with Monocryl 4-0 in subcuticular fashion. Specimen sent to pathology.    Complications: none   EBL: minimal

## 2020-11-01 NOTE — Anesthesia Postprocedure Evaluation (Signed)
Anesthesia Post Note  Patient: Kristen Bush  Procedure(s) Performed: EXCISION LIPOMA (Back)  Patient location during evaluation: PACU Anesthesia Type: General Level of consciousness: awake and alert and oriented Pain management: pain level controlled Vital Signs Assessment: post-procedure vital signs reviewed and stable Respiratory status: spontaneous breathing, nonlabored ventilation and respiratory function stable Cardiovascular status: blood pressure returned to baseline and stable Postop Assessment: no signs of nausea or vomiting Anesthetic complications: no   No notable events documented.   Last Vitals:  Vitals:   11/01/20 1245 11/01/20 1339  BP: (!) 142/88 130/77  Pulse: 70 (!) 55  Resp: 18 16  Temp:    SpO2: 100% 100%    Last Pain:  Vitals:   11/01/20 1339  TempSrc:   PainSc: 0-No pain                 Abbagail Scaff

## 2020-11-01 NOTE — Anesthesia Preprocedure Evaluation (Signed)
Anesthesia Evaluation  Patient identified by MRN, date of birth, ID band Patient awake    Reviewed: Allergy & Precautions, NPO status , Patient's Chart, lab work & pertinent test results  History of Anesthesia Complications Negative for: history of anesthetic complications  Airway Mallampati: II  TM Distance: >3 FB Neck ROM: Full    Dental no notable dental hx.    Pulmonary neg pulmonary ROS, neg sleep apnea, neg COPD,    breath sounds clear to auscultation- rhonchi (-) wheezing      Cardiovascular Exercise Tolerance: Good hypertension, Pt. on medications (-) CAD, (-) Past MI, (-) Cardiac Stents and (-) CABG  Rhythm:Regular Rate:Normal - Systolic murmurs and - Diastolic murmurs    Neuro/Psych neg Seizures PSYCHIATRIC DISORDERS Depression negative neurological ROS     GI/Hepatic negative GI ROS, Neg liver ROS,   Endo/Other  negative endocrine ROSneg diabetes  Renal/GU negative Renal ROS     Musculoskeletal negative musculoskeletal ROS (+)   Abdominal (+) + obese,   Peds  Hematology negative hematology ROS (+)   Anesthesia Other Findings Past Medical History: No date: Hypertension No date: Situational depression   Reproductive/Obstetrics                             Anesthesia Physical Anesthesia Plan  ASA: 2  Anesthesia Plan: General   Post-op Pain Management:    Induction: Intravenous  PONV Risk Score and Plan: 2 and Propofol infusion  Airway Management Planned: Natural Airway  Additional Equipment:   Intra-op Plan:   Post-operative Plan:   Informed Consent: I have reviewed the patients History and Physical, chart, labs and discussed the procedure including the risks, benefits and alternatives for the proposed anesthesia with the patient or authorized representative who has indicated his/her understanding and acceptance.     Dental advisory given  Plan Discussed with:  CRNA and Anesthesiologist  Anesthesia Plan Comments:         Anesthesia Quick Evaluation

## 2020-11-01 NOTE — Interval H&P Note (Signed)
History and Physical Interval Note:  11/01/2020 9:41 AM  Kristen Bush  has presented today for surgery, with the diagnosis of R22.2 Mass on back.  The various methods of treatment have been discussed with the patient and family. After consideration of risks, benefits and other options for treatment, the patient has consented to  Procedure(s): EXCISION LIPOMA (N/A) as a surgical intervention.  The patient's history has been reviewed, patient examined, no change in status, stable for surgery.  I have reviewed the patient's chart and labs.  Questions were answered to the patient's satisfaction.     Herbert Pun

## 2020-11-01 NOTE — Discharge Instructions (Addendum)

## 2020-11-02 ENCOUNTER — Encounter: Payer: Self-pay | Admitting: General Surgery

## 2020-11-04 LAB — SURGICAL PATHOLOGY

## 2021-04-25 ENCOUNTER — Other Ambulatory Visit: Payer: Self-pay | Admitting: Family Medicine

## 2021-04-25 DIAGNOSIS — Z1231 Encounter for screening mammogram for malignant neoplasm of breast: Secondary | ICD-10-CM

## 2021-07-08 ENCOUNTER — Ambulatory Visit
Admission: RE | Admit: 2021-07-08 | Discharge: 2021-07-08 | Disposition: A | Discharge status: Home / Self Care | Payer: Medicaid Other | Source: Ambulatory Visit | Attending: Family Medicine | Admitting: Family Medicine

## 2021-07-08 DIAGNOSIS — Z1231 Encounter for screening mammogram for malignant neoplasm of breast: Secondary | ICD-10-CM | POA: Diagnosis not present

## 2022-05-04 ENCOUNTER — Ambulatory Visit: Payer: Medicaid Other

## 2022-05-04 DIAGNOSIS — K640 First degree hemorrhoids: Secondary | ICD-10-CM | POA: Diagnosis not present

## 2022-05-04 DIAGNOSIS — K5730 Diverticulosis of large intestine without perforation or abscess without bleeding: Secondary | ICD-10-CM | POA: Diagnosis not present

## 2022-05-04 DIAGNOSIS — K635 Polyp of colon: Secondary | ICD-10-CM | POA: Diagnosis not present

## 2022-05-04 DIAGNOSIS — D128 Benign neoplasm of rectum: Secondary | ICD-10-CM | POA: Diagnosis not present

## 2022-05-04 DIAGNOSIS — Z1211 Encounter for screening for malignant neoplasm of colon: Secondary | ICD-10-CM | POA: Diagnosis not present

## 2022-08-03 ENCOUNTER — Other Ambulatory Visit: Payer: Self-pay

## 2022-08-03 DIAGNOSIS — Z1231 Encounter for screening mammogram for malignant neoplasm of breast: Secondary | ICD-10-CM

## 2022-09-07 ENCOUNTER — Ambulatory Visit
Admission: RE | Admit: 2022-09-07 | Discharge: 2022-09-07 | Disposition: A | Discharge status: Home / Self Care | Payer: Medicaid Other | Source: Ambulatory Visit

## 2022-09-07 DIAGNOSIS — Z1231 Encounter for screening mammogram for malignant neoplasm of breast: Secondary | ICD-10-CM | POA: Diagnosis present

## 2022-10-04 IMAGING — MG MM DIGITAL SCREENING BILAT W/ TOMO AND CAD
6 of 10 series · 6 of 30 positions shown · non-contrast
Comparison: Previous exam(s).

CLINICAL DATA: Screening.

EXAM:
DIGITAL SCREENING BILATERAL MAMMOGRAM WITH TOMOSYNTHESIS AND CAD
TECHNIQUE: Bilateral screening digital craniocaudal and mediolateral oblique
mammograms were obtained. Bilateral screening digital breast
tomosynthesis was performed. The images were evaluated with
computer-aided detection.

[R CC synth-2D]
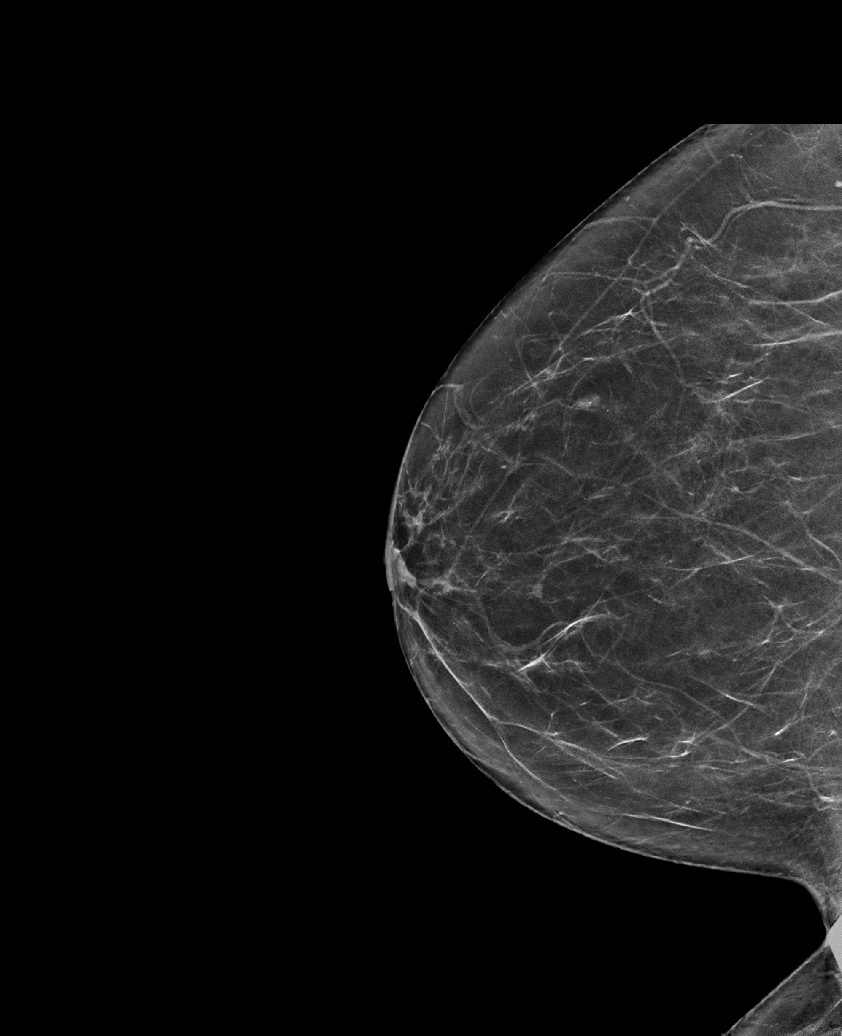

[R MLO synth-2D (1 of 2)]
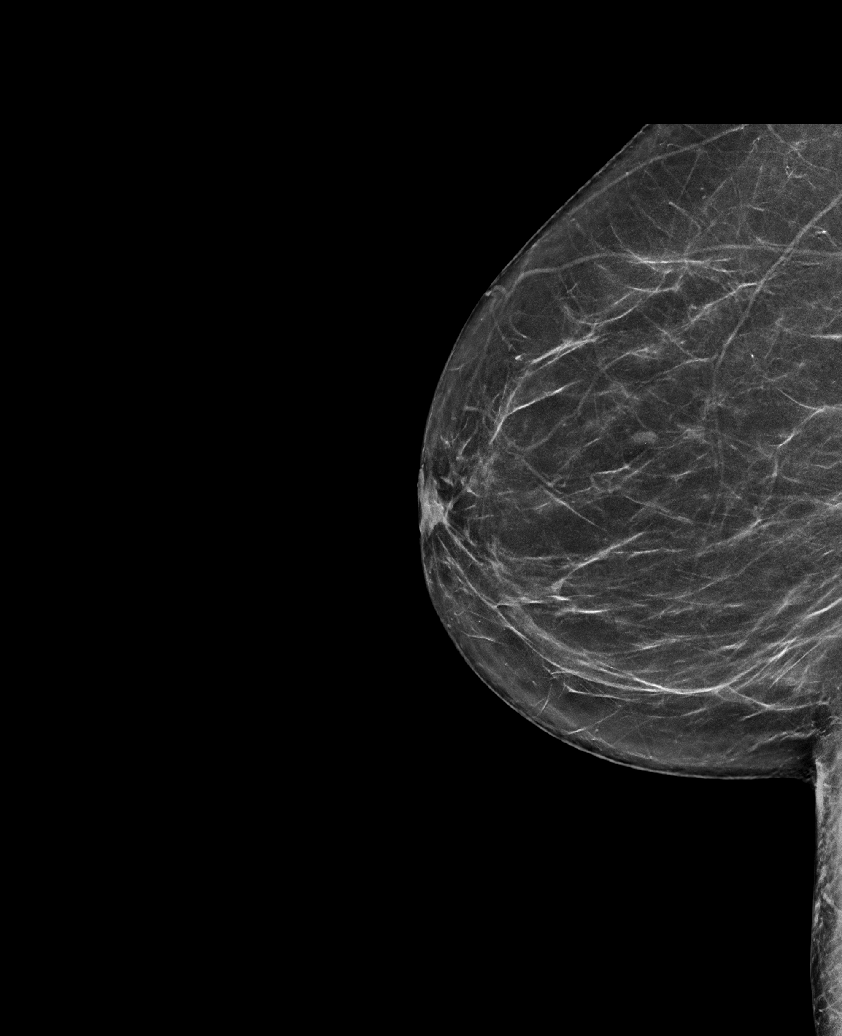

[R MLO synth-2D (2 of 2)]
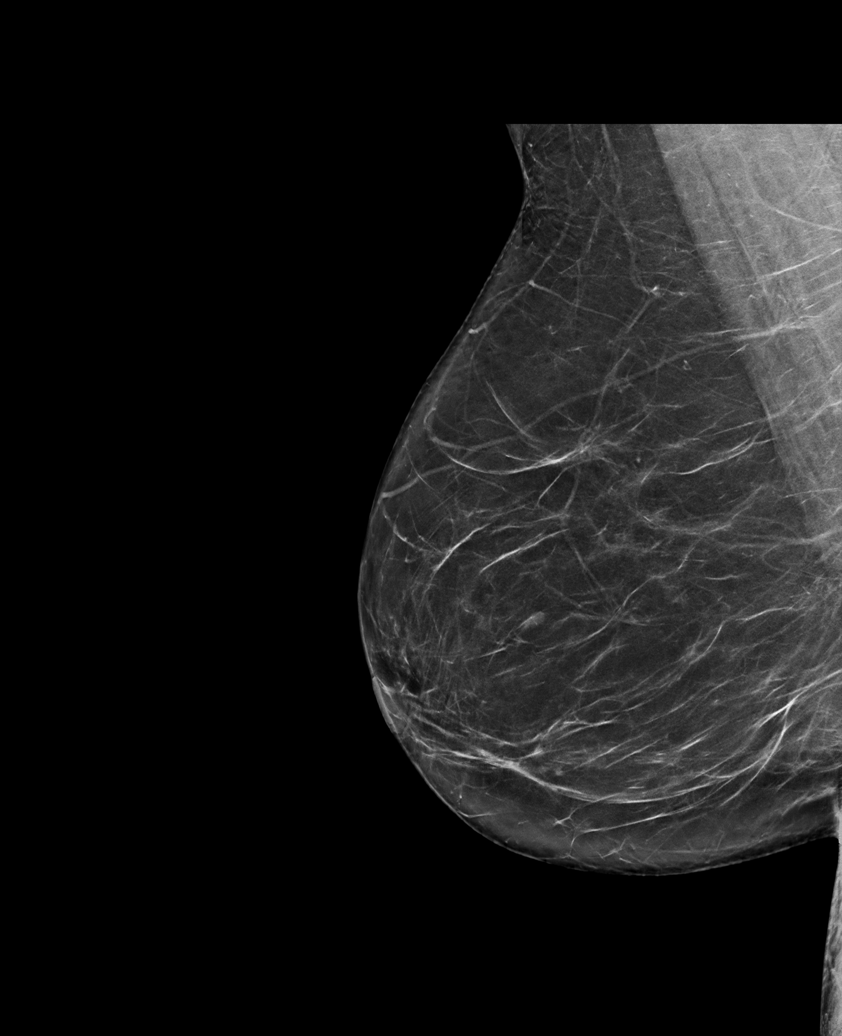

[L CC synth-2D]
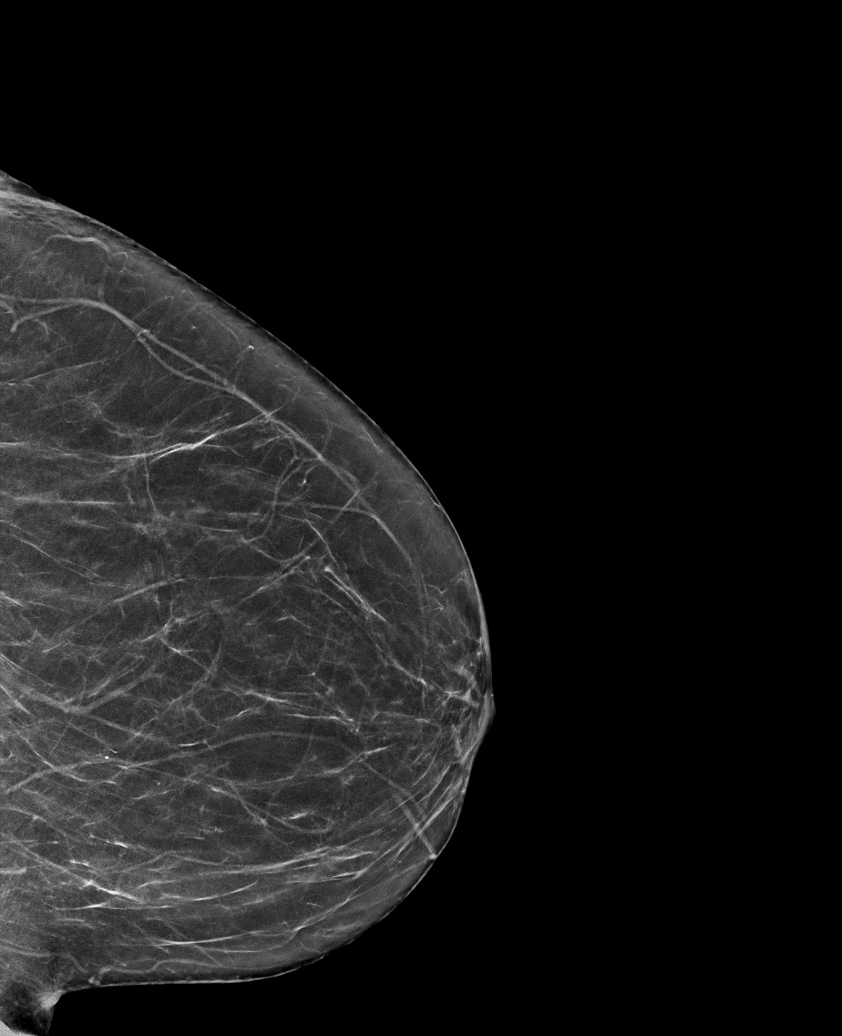

[L MLO synth-2D]
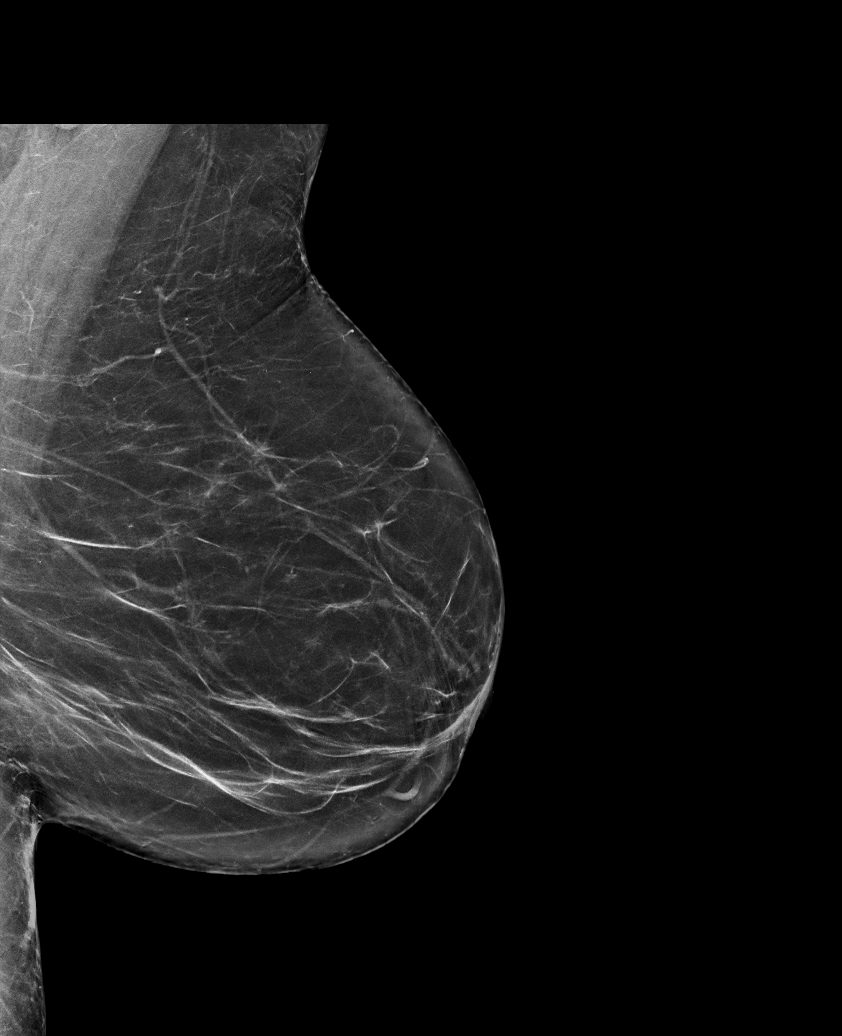

[R CC tomo · tomo slice 33/66.0]
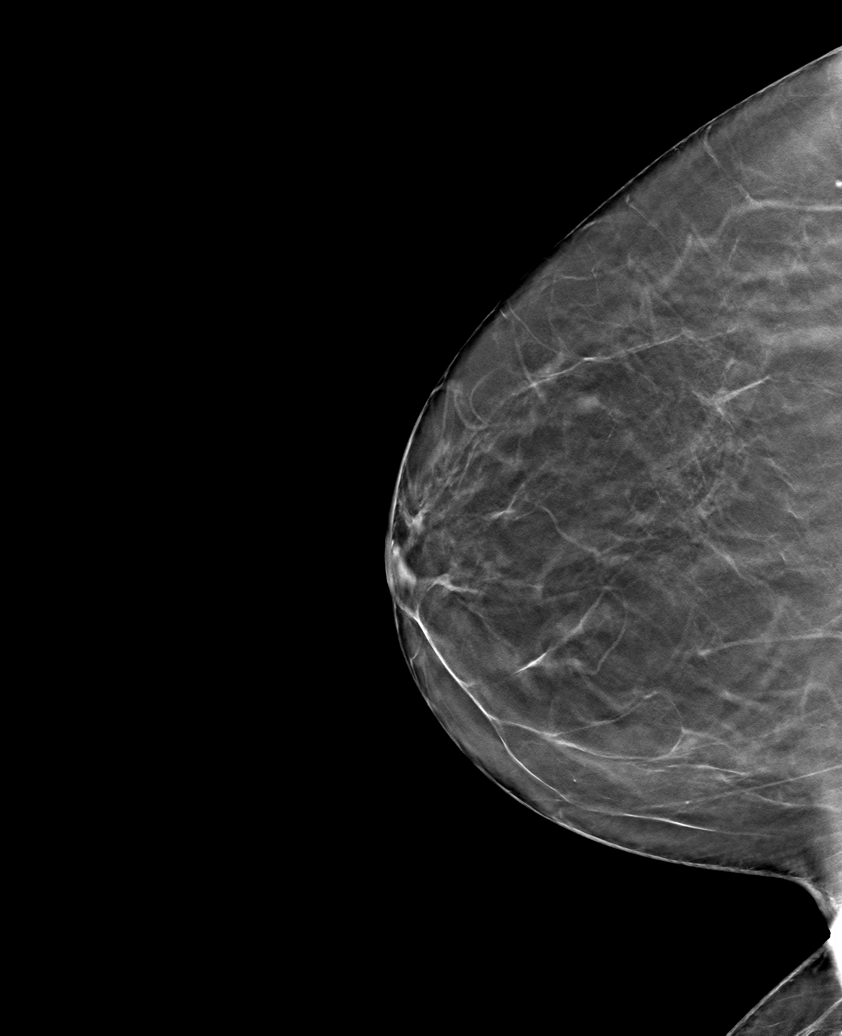

[6 of 30 positions shown; findings below may reference images not displayed]

ACR Breast Density Category b: There are scattered areas of
fibroglandular density.
FINDINGS: There are no findings suspicious for malignancy.
IMPRESSION: No mammographic evidence of malignancy. A result letter of this
screening mammogram will be mailed directly to the patient.

RECOMMENDATION:
Screening mammogram in one year. (Code:51-O-LD2)

BI-RADS CATEGORY  1: Negative.

## 2022-11-20 DIAGNOSIS — F32A Depression, unspecified: Secondary | ICD-10-CM | POA: Insufficient documentation

## 2022-11-20 DIAGNOSIS — E669 Obesity, unspecified: Secondary | ICD-10-CM | POA: Insufficient documentation

## 2022-11-20 DIAGNOSIS — I10 Essential (primary) hypertension: Secondary | ICD-10-CM | POA: Insufficient documentation

## 2023-02-08 ENCOUNTER — Ambulatory Visit (INDEPENDENT_AMBULATORY_CARE_PROVIDER_SITE_OTHER): Payer: Medicaid Other | Admitting: Internal Medicine

## 2023-02-08 VITALS — BP 129/83 | HR 62 | Resp 12 | Ht 64.0 in | Wt 214.0 lb

## 2023-02-08 DIAGNOSIS — Z6836 Body mass index (BMI) 36.0-36.9, adult: Secondary | ICD-10-CM

## 2023-02-08 DIAGNOSIS — G2581 Restless legs syndrome: Secondary | ICD-10-CM

## 2023-02-08 DIAGNOSIS — I10 Essential (primary) hypertension: Secondary | ICD-10-CM | POA: Diagnosis not present

## 2023-02-08 DIAGNOSIS — E66812 Obesity, class 2: Secondary | ICD-10-CM | POA: Diagnosis not present

## 2023-02-08 DIAGNOSIS — R0683 Snoring: Secondary | ICD-10-CM | POA: Diagnosis not present

## 2023-02-08 NOTE — Progress Notes (Signed)
Sleep Medicine   Office Visit  Patient Name: Kristen Bush DOB: 12-Nov-1975 MRN [id number]    Chief Complaint: sleep evaluation  Brief History:  Kristen Bush presents for an initial consult for sleep evaluation and to establish care.  Patient states her sleep quality is good because she feels rested when she wakes each day. In the past she reports having relatives that told her she snores and she woke with a snore on occasion. The patient relates the following symptoms: denies EDS, snoring or daytime fatigue currently. The patient goes to sleep at 2:00am  and wakes up at 7:30am. She reports that she takes a long time to settle down before sleep. If she goes before her eyes are heavy, she will toss and turn and ruminate. Sleep quality is worse when outside home environment.  Patient has noted restlessness of her legs at night that would disrupt her sleep.  The patient  relates no unusual behavior during the night.  The patient relates reports a history of psychiatric problems (depression) . The Epworth Sleepiness Score is 6 out of 24 .  The patient relates  Cardiovascular risk factors include: hypertension.  The patient reports she is pursuing gastric bypass surgery currently.     ROS  General: (-) fever, (-) chills, (-) night sweat Nose and Sinuses: (-) nasal stuffiness or itchiness, (-) postnasal drip, (-) nosebleeds, (-) sinus trouble. Mouth and Throat: (-) sore throat, (-) hoarseness. Neck: (-) swollen glands, (-) enlarged thyroid, (-) neck pain. Respiratory: - cough, - shortness of breath, - wheezing. Neurologic: - numbness, - tingling. Psychiatric: - anxiety, + depression Sleep behavior: -sleep paralysis -hypnogogic hallucinations -dream enactment      -vivid dreams -cataplexy -night terrors -sleep walking   Current Medication: Outpatient Encounter Medications as of 02/08/2023  Medication Sig   acetaminophen (TYLENOL) 325 MG tablet Take 650 mg by mouth every 6 (six) hours as needed for  moderate pain.   clobetasol ointment (TEMOVATE) 0.05 % Apply 1 application topically daily as needed (vaginal dryness).   Cyanocobalamin (B-12) 3000 MCG CAPS Take 3,000 mcg by mouth daily.   hydrOXYzine (VISTARIL) 25 MG capsule Take 25 mg by mouth 3 (three) times daily as needed.   lisinopril-hydrochlorothiazide (ZESTORETIC) 20-12.5 MG tablet Take 1 tablet by mouth daily.   sertraline (ZOLOFT) 50 MG tablet Take 50 mg by mouth daily.   VAGIFEM 10 MCG TABS vaginal tablet Place 1 tablet vaginally 2 (two) times a week.   [DISCONTINUED] cetirizine (ZYRTEC) 10 MG tablet Take 10 mg by mouth daily as needed for allergies.   [DISCONTINUED] Multiple Vitamins-Minerals (ONE-A-DAY WOMENS PO) Take 1 tablet by mouth daily.   [DISCONTINUED] multivitamin-lutein (OCUVITE-LUTEIN) CAPS capsule Take 1 capsule by mouth daily.   [DISCONTINUED] Omega-3 Fatty Acids (FISH OIL) 1000 MG CAPS Take 1,000 mg by mouth daily.   [DISCONTINUED] sertraline (ZOLOFT) 25 MG tablet Take 25 mg by mouth daily.   No facility-administered encounter medications on file as of 02/08/2023.    Surgical History: Past Surgical History:  Procedure Laterality Date   BREAST BIOPSY Left 2010   Neg   LIPOMA EXCISION     LIPOMA EXCISION N/A 11/01/2020   Procedure: EXCISION LIPOMA;  Surgeon: Carolan Shiver, MD;  Location: [facility] ORS;  Service: General;  Laterality: N/A;    Medical History: Past Medical History:  Diagnosis Date   Hypertension    Situational depression     Family History: Non contributory to the present illness  Social History: Social History   Socioeconomic History  Marital status: Divorced    Spouse name: Not on file   Number of children: Not on file   Years of education: Not on file   Highest education level: Not on file  Occupational History   Not on file  Tobacco Use   Smoking status: Never   Smokeless tobacco: Never  Substance and Sexual Activity   Alcohol use: Never   Drug use: Never   Sexual  activity: Not on file  Other Topics Concern   Not on file  Social History Narrative   Not on file   Social Drivers of Health   Financial Resource Strain: Not on file  Food Insecurity: Not on file  Transportation Needs: Not on file  Physical Activity: Not on file  Stress: Not on file  Social Connections: Not on file  Intimate Partner Violence: Not on file    Vital Signs: Blood pressure 129/83, pulse 62, resp. rate 12, height 5\' 4"  (1.626 m), weight 214 lb (97.1 kg), SpO2 97%. Body mass index is 36.73 kg/m.   Examination: General Appearance: The patient is well-developed, well-nourished, and in no distress. Neck Circumference: 36.5cm Skin: Gross inspection of skin unremarkable. Head: normocephalic, no gross deformities. Eyes: no gross deformities noted. ENT: ears appear grossly normal Neurologic: Alert and oriented. No involuntary movements.    STOP BANG RISK ASSESSMENT S (snore) Have you been told that you snore?     YES   T (tired) Are you often tired, fatigued, or sleepy during the day?   NO  O (obstruction) Do you stop breathing, choke, or gasp during sleep? NO   P (pressure) Do you have or are you being treated for high blood pressure? YES   B (BMI) Is your body index greater than 35 kg/m? YES   A (age) Are you 46 years old or older? NO   N (neck) Do you have a neck circumference greater than 16 inches?   NO   G (gender) Are you a female? NO   TOTAL STOP/BANG "YES" ANSWERS 3                                                               A STOP-Bang score of 2 or less is considered low risk, and a score of 5 or more is high risk for having either moderate or severe OSA. For people who score 3 or 4, doctors may need to perform further assessment to determine how likely they are to have OSA.         EPWORTH SLEEPINESS SCALE:  Scale:  (0)= no chance of dozing; (1)= slight chance of dozing; (2)= moderate chance of dozing; (3)= high chance of dozing  Chance   Situtation    Sitting and reading: 0    Watching TV: 1    Sitting Inactive in public: 0    As a passenger in car: 0      Lying down to rest: 2    Sitting and talking: 0    Sitting quielty after lunch: 1    In a car, stopped in traffic: 0   TOTAL SCORE:   6 out of 24    SLEEP STUDIES:  None   LABS: No results found for this or any previous visit (from the past 2160 hours).  Radiology: MM 3D SCREENING MAMMOGRAM BILATERAL BREAST Result Date: 09/08/2022 CLINICAL DATA:  Screening. EXAM: DIGITAL SCREENING BILATERAL MAMMOGRAM WITH TOMOSYNTHESIS AND CAD TECHNIQUE: Bilateral screening digital craniocaudal and mediolateral oblique mammograms were obtained. Bilateral screening digital breast tomosynthesis was performed. The images were evaluated with computer-aided detection. COMPARISON:  Previous exam(s). ACR Breast Density Category b: There are scattered areas of fibroglandular density. FINDINGS: There are no findings suspicious for malignancy. IMPRESSION: No mammographic evidence of malignancy. A result letter of this screening mammogram will be mailed directly to the patient. RECOMMENDATION: Screening mammogram in one year. (Code:SM-B-01Y) BI-RADS CATEGORY  1: Negative. Electronically Signed   By: Jacob Moores M.D.   On: 09/08/2022 17:08    No results found.  No results found.    Assessment and Plan: Patient Active Problem List   Diagnosis Date Noted   Snoring 02/08/2023   Restless leg syndrome 02/08/2023   Obesity 11/20/2022   Hypertension 11/20/2022   Depression 11/20/2022   1. Snoring (Primary) Patient evaluation suggests high risk of sleep disordered breathing due to obesity, snoring, daytime sleepiness.  Patient has comorbid cardiovascular risk factors including: hypertension which could be exacerbated by pathologic sleep-disordered breathing.  Suggest: PSG to assess/treat the patient's sleep disordered breathing. The patient was also counselled on weight loss  to optimize sleep health.  2. Hypertension, unspecified type Controlled with lisinopril- hydrochlorothiazide. Continue.   3. Restless leg syndrome Symptomatic. Will evaluate after PSG  4. Class 2 severe obesity with serious comorbidity and body mass index (BMI) of 36.0 to 36.9 in adult, unspecified obesity type [facility]) Undergoing evaluation for bariatric surgery.      General Counseling: I have discussed the findings of the evaluation and examination with Coriann.  I have also discussed any further diagnostic evaluation thatmay be needed or ordered today. Mayetta verbalizes understanding of the findings of todays visit. We also reviewed her medications today and discussed drug interactions and side effects including but not limited excessive drowsiness and altered mental states. We also discussed that there is always a risk not just to her but also people around her. she has been encouraged to call the office with any questions or concerns that should arise related to todays visit.  No orders of the defined types were placed in this encounter.       I have personally obtained a history, evaluated the patient, evaluated pertinent data, formulated the assessment and plan and placed orders.  This patient was seen today by Emmaline Kluver, PA-C in collaboration with Dr. Freda Munro.    Yevonne Pax, MD [facility] Diplomate ABMS Pulmonary and Critical Care Medicine Sleep medicine

## 2023-03-23 ENCOUNTER — Other Ambulatory Visit: Payer: Self-pay | Admitting: Family Medicine

## 2023-03-23 DIAGNOSIS — Z1231 Encounter for screening mammogram for malignant neoplasm of breast: Secondary | ICD-10-CM

## 2023-09-24 HISTORY — PX: OTHER SURGICAL HISTORY: SHX169

## 2024-01-11 NOTE — ED Provider Notes (Signed)
 [facility] Emergency Department Provider Note    ED Clinical Impression     Diagnosis ICD-10-CM Associated Orders  1. 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  On initial presentation, patient was not in acute distress and hemodynamically stable.  Differential diagnosis includes but is not limited to renal calculi, anemia, electrolyte abnormalities, pyelonephritis, cystitis, pancreatitis, appendicitis, ovarian torsion, dehydration, pregnancy.  ED Course as of 01/11/24 1538  Tue Jan 11, 2024  1215 CBC shows no leukocytosis, hemoglobin within normal limits.  1236 CMP shows no significant electrolyte abnormalities, creatinine within normal limits, alk phos slightly elevated at 132.  Lipase within normal limits.  1236 Urinalysis shows elevated specific gravity with large blood, no leukocytes or nitrites to suggest infection however.  1237 Pending CT abdomen pelvis, at this time high suspicion for kidney stone but want to rule out other emergent pathology given history of recent abdominal surgery.  1513 CT abdomen pelvis showing: Right-sided hydronephrosis with 5 mm obstructing stone in the proximal to mid ureter, concerning for pyelitis.   1514 Spoke to radiology reading room in regards to pyelitis, meaning that likely reactive and not true infection, no leukocytes or nitrites on urinalysis, patient afebrile.  1536 Low suspicion for ovarian pathology, symptoms have been consistent and have renal calculi CT, abdominal pain more superior to the suprapubic region.  1537 On reevaluation, patient symptoms have improved after pain medication, per  pathway will follow-up with PCP and classified as low risk, she is in agreement with treatment plan, given strict ED return precautions, and discharged.    Additional MDM Elements  {LOW RISK RENAL COLIC PATIENT (All of the following criteria) MODERATE RISK RENAL COLIC PATIENT  (All of the following criteria) HIGH RISK RENAL COLIC PATIENT (Any one of the following criteria)  .Single ureteral/renal stone <7 mm in largest diameter on imaging .No other illness requiring hospitalization .Pain controlled w/ non-narcotic analgesics  .NO evidence of UTI/sepsis .NO evidence of acute kidney injury (AKI) OR AKI responsive to fluids  DISPOSITION: .Ambulatory referral to urology clinic in ~ 4 weeks  OR .Encourage follow-up with established outside urologist  OR .Ambulatory referral to same day Internal Medicine clinic at [facility]) .Single ureteral/renal stone 7-10 mm in largest diameter on imaging OR multiple stones of any size up to 10 mm .No other illness requiring hospitalization .Pain only controlled with narcotic analgesics .No evidence of UTI/sepsis .NO evidence of acute kidney injury (AKI) OR AKI responsive to fluids  DISPOSITION: .Ambulatory referral to urgent urology clinic  .Single or multiple ureteral/renal stones >10 mm in largest diameter on imaging  .Bilateral ureteral stones of any size .Ureteral/renal stone in solitary kidney .Other illness requiring hospitalization .Pain poorly controlled by narcotics/non-narcotic analgesics .Repeat ED visit for renal colic within 30 days of original presentation .Evidence of UTI/sepsis .AKI unresponsive to fluids Pregnant  DISPOSITION: .Urology consult and likely observation unit stay OR admission      For low risk follow-up orders: Please place a routine (~4 weeks) order for ambulatory referral to urology and specify [facility] urology [address] as the referral department. In the comments section please specify for  kidney  stone  For moderate risk follow-up orders: Please place a urgent (1-2 weeks) order for ambulatory referral to urology and specify [facility] urology [address] as the referral department. In the comments section please specify for kidney stone  Please also include the following patient education dot phrase in the patient's AVS: English: .EDPROVKIDNEYSTONEDCENG Spanish: .[facility] ED Renal Colic Assessment: Based on evaluation, this patient is low risk.  Subsequent evaluation will consist of PCP follow up per pathway.    I have reviewed recent and relavant previous record, including: Outpatient notes - [facility] Office Visit on 8/25/025 for [facility]  Social Determinants that significantly affected care: None     Portions of this record have been created using [employer]. Dictation errors have been sought, but may not have been identified and corrected.  See chart and nursing documentation for additional ED course details.  ____________________________________________      History     Reason for Visit Abdominal Pain and Hematuria   HPI  Kristen Bush is a 48 y.o. 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 The patient reports 3 days of a dull, cramping to her lower right abdomen with radiation to her right flank and right back with associated diminished appetite, nausea, hematuria, and emesis. She took Tylenol , which initially subsided her pain, but she still endorsed a pressure in her abdomen. Her pain was worse after waking up, and seemed to improve throughout the day with ambulation. Today, her pain worsened with a pressure-like burning sensation, with 12/10 in severity. Her pain has caused her to feel lightheaded and weak. Her pain has not relieved with Tylenol . She endorsed dry heaving and diarrhea today. She is allergic to  Erythromycin. Denies fever, urine malodor, dysuria, dizziness, blurred vision, chest pain, shortness of breath, or abdominal trauma.   Outside Historian(s) (EMS, Significant Other, Family, Parent, Caregiver, Friend, Patent Examiner, etc.)  None  Past Medical History[1]  Past Surgical History[2]  No current facility-administered medications for this encounter.  Current Outpatient Medications:  .  acetaminophen  500 mg capsule, Take 1,000 mg by mouth every six (6) hours as needed for fever for up to 10 days., Disp: 30 capsule, Rfl: 0 .  beta-carotene,A,-vits C,E/mins (OCUVITE ORAL), Take 1 capsule by mouth daily., Disp: , Rfl:  .  black seed 4.5 gram/5 mL Oil, Take 1 capsule by mouth daily., Disp: , Rfl:  .  clobetasol (TEMOVATE) 0.05 % ointment, Apply topically., Disp: , Rfl:  .  hydrOXYzine (VISTARIL) 25 MG capsule, TAKE 1 CAPSULE (25 MG TOTAL) BY MOUTH 3 (THREE) TIMES DAILY AS NEEDED FOR ITCHING OR ANXIETY, Disp: , Rfl:  .  [Paused] lisinopril-hydroCHLOROthiazide (PRINZIDE,ZESTORETIC) 20-12.5 mg per tablet, TAKE 1 TABLET BY MOUTH DAILY FOR HIGH BLOOD PRESSURE, Disp: , Rfl:  .  multivitamin, therapeutic with minerals (CENTRUM) 9 mg/15 mL iron Liqd, Take 15 mL by mouth daily., Disp: 472 mL, Rfl: 0 .  omeprazole (PRILOSEC) 20 MG capsule, Take 1 capsule (20 mg total) by mouth in the morning (open capsule and sprinkle on water to take it), Disp: 90 capsule, Rfl: 1 .  ondansetron  (ZOFRAN -ODT) 4 MG disintegrating tablet, Dissolve 1 tablet (4 mg total) in the mouth every six (6) hours., Disp: 28 tablet, Rfl: 0 .  oxyCODONE  (OXY-IR) 5 mg capsule, Take 1 capsule (5 mg total) by mouth once as needed for pain for up to 10 doses., Disp: 2 capsule, Rfl: 0 .  oxyCODONE  (ROXICODONE ) 5 mg/5 mL solution, Take 10 mL (10 mg total) by mouth every four (4) hours as needed., Disp: 50 mL, Rfl: 0 .  sertraline (ZOLOFT) 50 MG tablet, TAKE 1 TABLET BY MOUTH ONCE A DAY FOR MOOD, Disp: , Rfl:  .  tamsulosin (FLOMAX)  0.4 mg capsule, Take 1 capsule (0.4 mg total) by mouth daily for 10 days., Disp: 10 capsule, Rfl: 0 .  UNABLE TO FIND, Med Name: Proteinex Hydrolyzed liquid 18 g max strength (watermelon) per patient, Disp: , Rfl:  .  ursodiol (ACTIGALL) 300 mg capsule, Take 1 capsule (300 mg total) by mouth two (2) times a day., Disp: 60 capsule, Rfl: 5 .  VAGIFEM 10 mcg vaginal tablet, PLACE 1 TABLET VAGINALLY TWICE A WEEK, Disp: , Rfl:   Allergies Erythromycin and Latex  Family History[3]  Short Social History[4]    Physical Exam   ED Triage Vitals  Enc Vitals Group     BP 01/11/24 1109 149/107     Pulse 01/11/24 1105 69     SpO2 Pulse --      Resp 01/11/24 1109 18     Temp 01/11/24 1109 36.7 C (98.1 F)     Temp Source 01/11/24 1109 Oral     SpO2 01/11/24 1105 98 %     Weight 01/11/24 1107 88.7 kg (195 lb 8.8 oz)   Constitutional: Alert and oriented. Well appearing and in no distress. Eyes: Conjunctivae are normal. ENT      Head: Normocephalic and atraumatic.      Nose: No congestion.      Mouth/Throat: Mucous membranes are moist.      Neck: No stridor. Cardiovascular: Normal rate, regular rhythm. Normal and symmetric distal pulses are present in all extremities. Respiratory: Normal respiratory effort. Breath sounds are normal. Gastrointestinal: Right lower quadrant and right side upper quadrant (lateral) tenderness to palpation.  No CVA tenderness bilaterally. Genitourinary: Deferred. Musculoskeletal: Normal range of motion in all extremities. Neurologic: Normal speech and language.  Skin: Skin is warm, dry and intact. No rash noted. Psychiatric: Mood and affect are normal. Speech and behavior are normal.   Radiology   CT Abdomen Pelvis W IV Contrast Only  Final Result  Right-sided hydronephrosis with 5 mm obstructing stone in the proximal to mid ureter, concerning for pyelitis.    Hypoattenuating lesion in the hepatic lobe segment 4B measuring 1.4 x 1.8 x 3.2 cm.        Procedures including Critical Care   Documentation assistance was provided by Schuyler Gibney, Scribe on January 11, 2024 at 11:17 AM for [employer], DO.  Documentation assistance provided by the above mentioned scribe. I was present during the time the encounter was recorded. The information recorded by the scribe was done at my direction and has been reviewed and validated by me.         [1] Past Medical History: Diagnosis Date  . BMI 38.0-38.9,adult   . Diverticulosis   . Hx of renal calculi   . Hypertension   . Lipoma    multiple areas (Benign)  . Primary osteoarthritis of left knee   . Primary osteoarthritis, right shoulder   [2] Past Surgical History: Procedure Laterality Date  . CESAREAN SECTION     2007  . LIPOMA RESECTION     times 4  . PR LAP GASTRIC BYPASS/ROUX-EN-Y N/A 09/29/2023   Procedure: LAPAROSCOPY, SURG, GASTRIC RESTRICT PROC; W/GASTRIC BYPASS & ROUX-EN-Y GASTROENTEROS(ROUX LIMB 150 CM/LESS);  Surgeon: Librada Evalene Sharper, MD;  Location: OR [facility];  Service: Gastrointestinal  . PR LAP, REPAIR PARAESOPHAGEAL HERNIA, INCL FUNDOPLASTY W/O MESH N/A 09/29/2023   Procedure: LAPAROSCOPY, SURGICAL, REPAIR PARAESOPHAGEAL HERNIA, INCLUDE FUNDOPLASTY, WHEN PERFORMED; W/O MESH IMPLANT;  Surgeon: Librada Evalene Sharper, MD;  Location: OR [facility];  Service: Gastrointestinal  . PR LAP, SURG ENTEROLYSIS N/A 09/29/2023   Procedure: LAPAROSCOPY, SURGICAL, ENTEROLYSIS (FREEING OF INTESTINAL ADHESION) (SEPARATE PROCEDURE);  Surgeon: Librada Evalene Sharper, MD;  Location: OR [facility];  Service: Gastrointestinal  . PR TAP BLOCK BILATERAL BY INJECTION(S) N/A 09/29/2023   Procedure: TRANSVERSUS ABDOMINIS PLANE (TAP) BLOCK (ABDOMINAL PLANE BLOCK, RECTUS SHEATH BLOCK) BILATERAL; BY INJECTIONS (INCLUDES IMAGING GUIDANCE, WHEN PERFORMED);  Surgeon: Librada Evalene Sharper, MD;  Location: OR [facility];  Service: Gastrointestinal  . PR UPPER GI ENDOSCOPY,BIOPSY N/A 06/04/2023   Procedure: UGI  ENDOSCOPY; WITH BIOPSY, SINGLE OR MULTIPLE;  Surgeon: Junette Fonda CROME, MD;  Location: GI PROCEDURES [place] [facility];  Service: Gastroenterology  . PR UPPER GI ENDOSCOPY,DIAGNOSIS N/A 09/29/2023   Procedure: UGI ENDO, INCLUDE ESOPHAGUS, STOMACH, & DUODENUM &/OR JEJUNUM; DX W/WO COLLECTION SPECIMN, BY BRUSH OR WASH;  Surgeon: Librada Evalene Sharper, MD;  Location: OR [facility];  Service: Gastrointestinal  . TUBAL LIGATION    [3] Family History Problem Relation Age of Onset  . Diabetes type II Father   . Lupus Sister   . Cancer Sister        smoker, obesity, DM  . No Known Problems Daughter   . No Known Problems Son   . No Known Problems Son   [4] Social History Tobacco Use  . Smoking status: Never  . Smokeless tobacco: Never  Vaping Use  . Vaping status: Never Used  Substance Use Topics  . Alcohol use: Not Currently    Comment: 1-2 a year  . Drug use: Never   Marti Mitts, DO Resident 01/11/24 1539

## 2024-01-13 ENCOUNTER — Ambulatory Visit
Admission: RE | Admit: 2024-01-13 | Discharge: 2024-01-13 | Disposition: A | Discharge status: Home / Self Care | Source: Ambulatory Visit | Attending: Urology | Admitting: Urology

## 2024-01-13 ENCOUNTER — Ambulatory Visit (INDEPENDENT_AMBULATORY_CARE_PROVIDER_SITE_OTHER): Admitting: Urology

## 2024-01-13 VITALS — BP 124/83 | HR 80

## 2024-01-13 DIAGNOSIS — N200 Calculus of kidney: Secondary | ICD-10-CM

## 2024-01-13 MED ORDER — TAMSULOSIN HCL 0.4 MG PO CAPS: 0.4000 mg | ORAL_CAPSULE | Freq: Every day | ORAL | 0 refills | Status: DC

## 2024-01-13 MED ORDER — OXYCODONE HCL 5 MG PO TABS: 5.0000 mg | ORAL_TABLET | ORAL | 0 refills | Status: DC | PRN

## 2024-01-13 MED ORDER — PHENAZOPYRIDINE HCL 100 MG PO TABS: 100.0000 mg | ORAL_TABLET | Freq: Three times a day (TID) | ORAL | 0 refills | Status: DC | PRN

## 2024-01-13 NOTE — Progress Notes (Signed)
 01/13/24 3:44 PM   Kristen Bush [birth year] [id number]   HPI: 48 y.o. female here for initial evaluation of nephrolithiasis  ED visit (01/11/24, at [facility] ED) - acute Right flank pain  - CT A/P = 5mm Right proximal ureteral stone with hydro  - UA >182 RBC, 12 WBC, > 10 squam  - discharged on MET   Today, ongoing Right flank discomfort, 3-7/10 Taking oxycodone  + Flomax , cannot take NSAIDs due to stomach, not taking tylenol  They only gave her 2 pills oxycodone  in ED, 10 pills Flomax   Can only sip on water in small quantities due to stomach  No N/V, no fevers, no UTI symptoms  Hx of Roux-en-Y bypass (Aug 2025)  Remote history of nephrolithiasis 20+ years ago   PMH: Past Medical History:  Diagnosis Date   Hypertension    Situational depression     Surgical History: Past Surgical History:  Procedure Laterality Date   BREAST BIOPSY Left 2010   Neg   LIPOMA EXCISION     LIPOMA EXCISION N/A 11/01/2020   Procedure: EXCISION LIPOMA;  Surgeon: Rodolph Romano, MD;  Location: [facility] ORS;  Service: General;  Laterality: N/A;    Family History: Family History  Problem Relation Age of Onset   Breast cancer Sister    Breast cancer Maternal Aunt     Social History:  reports that she has never smoked. She has never used smokeless tobacco. She reports that she does not drink alcohol and does not use drugs.      Physical Exam: BP 124/83   Pulse 80    Constitutional:  Alert and oriented, No acute distress. Cardiovascular: No clubbing, cyanosis, or edema. Respiratory: Normal respiratory effort, no increased work of breathing. GI: Nondistended Skin: No rashes, bruises or suspicious lesions. Neurologic: Grossly intact, no focal deficits, moving all 4 extremities. Psychiatric: Normal mood and affect.  Laboratory Data: UA >182 RBC, 12 WBC, > 10 squam   Pertinent Imaging: I have personally viewed and interpreted the CT A/P (01/11/24 at [facility]) = no images to review- report  suggests 5mm Right proximal ureteral stone with hydro.    Assessment & Plan:    Nephrolithiasis Assessment & Plan: 5mm Right proximal ureteral stone (dx 01/11/24, by outside CT report)   We reviewed recent clinical history, lab data and CT report. Based on the reported size and location of their stone, I would recommend medical expulsive therapy and a trial of passage, at least initially. Provided adequate symptomatic control and an absence of infectious signs, I typically allow 3-4 weeks for spontaneous passage. Return precautions were verbalized, including new fevers/chills, intractable N/V, inability to tolerate PO liquids, or severe refractory pain.    - MET x 2 weeks - Will go ahead and request KUB and ESWL in ~2 weeks.  - Flomax  0.4mg  prescribed (#30 tabs) - r/b/SEs reviewed, will refill oxycodone . She needs to take scheduled high dose Tylenol  as well.  - Clinic / on-call info provided   Orders: -     Ambulatory Referral For Surgery Scheduling -     DG Abd 1 View; Future  Other orders -     Tamsulosin  HCl; Take 1 capsule (0.4 mg total) by mouth daily.  Dispense: 30 capsule; Refill: 0 -     oxyCODONE  HCl; Take 1 tablet (5 mg total) by mouth every 4 (four) hours as needed for up to 7 days for severe pain (pain score 7-10).  Dispense: 10 tablet; Refill: 0 -  Phenazopyridine HCl; Take 1 tablet (100 mg total) by mouth 3 (three) times daily as needed for pain.  Dispense: 10 tablet; Refill: 0      Penne Skye, MD 01/13/2024  [facility] [address], [address] [place], [place] [address] [phone]

## 2024-01-13 NOTE — Assessment & Plan Note (Addendum)
 5mm Right proximal ureteral stone (dx 01/11/24, by outside CT report)   We reviewed recent clinical history, lab data and CT report. Based on the reported size and location of their stone, I would recommend medical expulsive therapy and a trial of passage, at least initially. Provided adequate symptomatic control and an absence of infectious signs, I typically allow 3-4 weeks for spontaneous passage. Return precautions were verbalized, including new fevers/chills, intractable N/V, inability to tolerate PO liquids, or severe refractory pain.    - MET x 2 weeks - Will go ahead and request KUB and ESWL in ~2 weeks.  - Flomax 0.4mg  prescribed (#30 tabs) - r/b/SEs reviewed, will refill oxycodone . She needs to take scheduled high dose Tylenol  as well.  - Clinic / on-call info provided

## 2024-01-17 ENCOUNTER — Telehealth: Payer: Self-pay

## 2024-01-17 NOTE — Telephone Encounter (Signed)
 LVM for pateint to call us  back after trying to return her voicemail Pt stated in VM that she is having issues with her stone. It looks like an order for Lithotripsy has been placed but Eleanor is out of office until tomorrow.

## 2024-01-17 NOTE — Telephone Encounter (Signed)
 Pt called in with c/o of pain from her 5mm stone she is trying to pass on the right side. Pt voiced its hard to push fluids but she is trying to keep up with it. Pt is using tylenol  and Oxycodone  around the clock. Pt is using a heating pad as well and states this help a lot. I did suggest if she can tolerate it on her stomach to try Alieve ever 12 hours to see if this helps with the breakthrough pain. Pt was instructed to seek attention at ER is her pain becomes severe or she has a fever >101. Pt voiced understanding.

## 2024-01-18 ENCOUNTER — Other Ambulatory Visit: Payer: Self-pay

## 2024-01-18 ENCOUNTER — Telehealth: Payer: Self-pay

## 2024-01-18 DIAGNOSIS — N201 Calculus of ureter: Secondary | ICD-10-CM

## 2024-01-18 MED ORDER — TAMSULOSIN HCL 0.4 MG PO CAPS: 0.4000 mg | ORAL_CAPSULE | Freq: Every day | ORAL | 0 refills | Status: AC

## 2024-01-18 MED ORDER — KETOROLAC TROMETHAMINE 10 MG PO TABS: 10.0000 mg | ORAL_TABLET | Freq: Four times a day (QID) | ORAL | 0 refills | Status: AC | PRN

## 2024-01-18 MED ORDER — OXYCODONE HCL 5 MG PO TABS: 5.0000 mg | ORAL_TABLET | ORAL | 0 refills | Status: AC | PRN

## 2024-01-18 NOTE — Progress Notes (Signed)
   [facility] Surgical Posting Form  Surgery Date: Date: 01/25/2024  Surgeon: Dr. Penne Skye, MD  Inpt ( No  )   Outpt (Yes)   Obs ( No  )   Diagnosis: N20.1 Right Ureteral Stone  -CPT: [phone]  Surgery: Right Ureteroscopy with Laser Lithotripsy and Stent Placement   Stop Anticoagulations: Yes, hold all  Cardiac/Medical/Pulmonary Clearance needed: No  *Orders entered into EPIC  Date: 01/18/24   *Case booked in [place]  Date: 01/18/24  *Notified pt of Surgery: Date: 01/18/24  PRE-OP UA & CX: yes, will obtain in clinic on 01/19/2024  *Placed into Prior Authorization Work [place] Date: 01/18/24  Assistant/laser/rep:No

## 2024-01-18 NOTE — Telephone Encounter (Signed)
 Per Dr. Georganne, Patient is to be scheduled for Right Ureteroscopy with Laser Lithotripsy and Stent Placement   Kristen Bush was contacted and possible surgical dates were discussed, Tuesday December 2nd, 2025 was agreed upon for surgery.   Patient was instructed that Dr. Georganne will require them to provide a pre-op UA & CX prior to surgery. This was ordered and scheduled drop off appointment was made for 01/19/2024.    Patient was directed to call [phone] between 1-3pm the day before surgery to find out surgical arrival time.  Instructions were given not to eat or drink from midnight on the night before surgery and have a driver for the day of surgery. On the surgery day patient was instructed to enter through the [facility] entrance of [facility] report the Same Day Surgery desk.   Pre-Admit Testing will be in contact via phone to set up an interview with the anesthesia team to review your history and medications prior to surgery.   Reminder of this information was sent via MyChart to the patient.

## 2024-01-18 NOTE — Progress Notes (Signed)
 Surgical Physician Order Form [facility]   * Scheduling expectation : Next Available   *Length of Case: 45 min   *Clearance needed: no   *Anticoagulation Instructions: N/A   *Aspirin Instructions: N/A   *Post-op visit Date/Instructions:  1 month with RUS prior   *Diagnosis: Right Ureteral Stone   *Procedure: right Ureteroscopy w/laser lithotripsy & stent placement (47643)     Additional orders: N/A   -Admit type: OUTpatient   -Anesthesia: Choice   -VTE Prophylaxis Standing Order SCD's       Other:    -Standing Lab Orders Per Anesthesia     Lab other: UA&Urine Culture   -Standing Test orders EKG/Chest x-ray per Anesthesia             Test other:    - Medications:  Ancef  2gm IV   -Other orders:  N/A

## 2024-01-19 ENCOUNTER — Other Ambulatory Visit

## 2024-01-19 DIAGNOSIS — N201 Calculus of ureter: Secondary | ICD-10-CM

## 2024-01-19 LAB — MICROSCOPIC EXAMINATION

## 2024-01-19 NOTE — Assessment & Plan Note (Addendum)
 5mm Right proximal ureteral stone (dx 01/11/24, by outside CT report)   - KUB (01/13/24) - 5mm Right mid ureteral calcification   Unfortunately, she has had poor symptomatic control over the last week.  I do not think she will make it until ESWL availability.  As such, I offered her ureteroscopy and laser lithotripsy in the OR tomorrow.  Procedure explained in detail including perioperative pathway, outcomes, recovery and possible complications.  I do think we can render her stone free and without stent placement.  All questions answered, she was amenable to proceed.   - Plan for OR tomorrow: Right URS/LL, possible ureteral stent placement

## 2024-01-19 NOTE — Progress Notes (Signed)
   01/24/2024 11:00 AM   Kristen Bush 1975/12/26 [id number]  Reason for visit: Follow up nephrolithiasis   HPI: 48 y.o. female, follow up with me today She messaged several times via MyChart with severe pain requiring additional refills I offered her ureteroscopy tomorrow instead of ESWL at a later date Recent KUB -appears to have correspondent 5 mm right proximal to mid ureteral stone (although weakly radiopaque)   Prior HPI: ED visit (01/11/24, at [facility] ED) - acute Right flank pain  - CT A/P = 5mm Right proximal ureteral stone with hydro  - UA >182 RBC, 12 WBC, > 10 squam  - discharged on MET    Today, ongoing Right flank discomfort, 3-7/10 Taking oxycodone  + Flomax , cannot take NSAIDs due to stomach, not taking tylenol  They only gave her 2 pills oxycodone  in ED, 10 pills Flomax   Can only sip on water  in small quantities due to stomach  No N/V, no fevers, no UTI symptoms   Hx of Roux-en-Y bypass (Aug 2025)  Remote history of nephrolithiasis 20+ years ago    Physical Exam: BP 136/83   Pulse 65   Ht 5' 4 (1.626 m)   Wt 190 lb (86.2 kg)   BMI 32.61 kg/m    General: no acute distress, alert/oriented, conversational  HEENT: equal nondilated pupils CV: regular rate Lung: unlabored breathing, regular rate and rhythm  Abd: nondistended, nontender with palpation, no palpable masses  MSK: moving all extremities without issue, normal observed motor function  Laboratory Data: UA negative  Pertinent Imaging: I have personally viewed and interpreted the Recent KUB (11/20/2-appears to have correspondent 5 mm right proximal to mid ureteral stone .      Assessment & Plan:    Nephrolithiasis Assessment & Plan: 5mm Right proximal ureteral stone (dx 01/11/24, by outside CT report)   - KUB (01/13/24) - 5mm Right mid ureteral calcification   Unfortunately, she has had poor symptomatic control over the last week.  I do not think she will make it until ESWL availability.  As  such, I offered her ureteroscopy and laser lithotripsy in the OR tomorrow.  Procedure explained in detail including perioperative pathway, outcomes, recovery and possible complications.  I do think we can render her stone free and without stent placement.  All questions answered, she was amenable to proceed.   - Plan for OR tomorrow: Right URS/LL, possible ureteral stent placement         [name], MD  [facility] [address] [place], [place] [address] [phone]

## 2024-01-19 NOTE — H&P (View-Only) (Signed)
   01/24/2024 11:00 AM   Dwayne DELENA Sheen 1975/12/26 [id number]  Reason for visit: Follow up nephrolithiasis   HPI: 48 y.o. female, follow up with me today She messaged several times via MyChart with severe pain requiring additional refills I offered her ureteroscopy tomorrow instead of ESWL at a later date Recent KUB -appears to have correspondent 5 mm right proximal to mid ureteral stone (although weakly radiopaque)   Prior HPI: ED visit (01/11/24, at [facility] ED) - acute Right flank pain  - CT A/P = 5mm Right proximal ureteral stone with hydro  - UA >182 RBC, 12 WBC, > 10 squam  - discharged on MET    Today, ongoing Right flank discomfort, 3-7/10 Taking oxycodone  + Flomax , cannot take NSAIDs due to stomach, not taking tylenol  They only gave her 2 pills oxycodone  in ED, 10 pills Flomax   Can only sip on water  in small quantities due to stomach  No N/V, no fevers, no UTI symptoms   Hx of Roux-en-Y bypass (Aug 2025)  Remote history of nephrolithiasis 20+ years ago    Physical Exam: BP 136/83   Pulse 65   Ht 5' 4 (1.626 m)   Wt 190 lb (86.2 kg)   BMI 32.61 kg/m    General: no acute distress, alert/oriented, conversational  HEENT: equal nondilated pupils CV: regular rate Lung: unlabored breathing, regular rate and rhythm  Abd: nondistended, nontender with palpation, no palpable masses  MSK: moving all extremities without issue, normal observed motor function  Laboratory Data: UA negative  Pertinent Imaging: I have personally viewed and interpreted the Recent KUB (11/20/2-appears to have correspondent 5 mm right proximal to mid ureteral stone .      Assessment & Plan:    Nephrolithiasis Assessment & Plan: 5mm Right proximal ureteral stone (dx 01/11/24, by outside CT report)   - KUB (01/13/24) - 5mm Right mid ureteral calcification   Unfortunately, she has had poor symptomatic control over the last week.  I do not think she will make it until ESWL availability.  As  such, I offered her ureteroscopy and laser lithotripsy in the OR tomorrow.  Procedure explained in detail including perioperative pathway, outcomes, recovery and possible complications.  I do think we can render her stone free and without stent placement.  All questions answered, she was amenable to proceed.   - Plan for OR tomorrow: Right URS/LL, possible ureteral stent placement         [name], MD  [facility] [address] [place], [place] [address] [phone]

## 2024-01-20 LAB — MICROSCOPIC EXAMINATION: Mucus, UA: NONE SEEN

## 2024-01-20 LAB — URINALYSIS, COMPLETE
Glucose, UA: NEGATIVE
Nitrite, UA: NEGATIVE
Specific Gravity, UA: 1.03 (ref 1.005–1.030)
Urobilinogen, Ur: 1 mg/dL (ref 0.2–1.0)
pH, UA: 5.5 (ref 5.0–7.5)

## 2024-01-23 LAB — CULTURE, URINE COMPREHENSIVE

## 2024-01-24 ENCOUNTER — Encounter: Payer: Self-pay | Admitting: Urology

## 2024-01-24 ENCOUNTER — Ambulatory Visit (INDEPENDENT_AMBULATORY_CARE_PROVIDER_SITE_OTHER): Admitting: Urology

## 2024-01-24 ENCOUNTER — Encounter
Admission: RE | Admit: 2024-01-24 | Discharge: 2024-01-24 | Disposition: A | Source: Ambulatory Visit | Attending: Urology

## 2024-01-24 ENCOUNTER — Other Ambulatory Visit: Payer: Self-pay

## 2024-01-24 VITALS — BP 136/83 | HR 65 | Ht 64.0 in | Wt 190.0 lb

## 2024-01-24 DIAGNOSIS — N200 Calculus of kidney: Secondary | ICD-10-CM

## 2024-01-24 HISTORY — DX: Personal history of urinary calculi: Z87.442

## 2024-01-24 HISTORY — DX: Anxiety disorder, unspecified: F41.9

## 2024-01-24 HISTORY — DX: Morbid (severe) obesity due to excess calories: E66.01

## 2024-01-24 HISTORY — DX: Acute cystitis with hematuria: N30.01

## 2024-01-24 MED ORDER — CHLORHEXIDINE GLUCONATE 0.12 % MT SOLN
15.0000 mL | Freq: Once | OROMUCOSAL | Status: AC
  Administered 2024-01-25: 15 mL via OROMUCOSAL

## 2024-01-24 MED ORDER — ORAL CARE MOUTH RINSE: 15.0000 mL | Freq: Once | OROMUCOSAL | Status: AC

## 2024-01-24 MED ORDER — LACTATED RINGERS IV SOLN: INTRAVENOUS | Status: DC

## 2024-01-24 MED ORDER — CEFAZOLIN SODIUM-DEXTROSE 2-4 GM/100ML-% IV SOLN
2.0000 g | INTRAVENOUS | Status: AC
  Administered 2024-01-25: 2 g via INTRAVENOUS

## 2024-01-24 NOTE — Patient Instructions (Addendum)
 Your procedure is scheduled on: Tuesday 01/25/24  Report to the Registration Desk on the 1st floor of the Medical Mall. To find out your arrival time, please call [phone] between 1PM - 3PM on: Monday 01/24/24  If your arrival time is 6:00 am, do not arrive before that time as the Medical Mall entrance doors do not open until 6:00 am.  REMEMBER: Instructions that are not followed completely may result in serious medical risk, up to and including death; or upon the discretion of your surgeon and anesthesiologist your surgery may need to be rescheduled.  Do not eat food or drink fluids after midnight the night before surgery.  No gum chewing or hard candies.  One week prior to surgery: Stop Anti-inflammatories (NSAIDS) such as Advil, Aleve, Ibuprofen, Motrin, Naproxen, Naprosyn and Aspirin based products such as Excedrin, Goody's Powder, BC Powder. Stop ANY OVER THE COUNTER supplements until after surgery.  You may however, continue to take Tylenol  if needed for pain up until the day of surgery.  Continue taking all of your other prescription medications up until the day of surgery.  ON THE DAY OF SURGERY ONLY TAKE THESE MEDICATIONS WITH SIPS OF WATER:  sertraline (ZOLOFT) 50 MG  tamsulosin  (FLOMAX ) 0.4 MG CAPS oxyCODONE  (OXY IR/ROXICODONE ) 5 MG immediate release tablet  4.   ursodiol (ACTIGALL) 300 MG    No Alcohol for 24 hours before or after surgery.  No Smoking including e-cigarettes for 24 hours before surgery.  No chewable tobacco products for at least 6 hours before surgery.  No nicotine patches on the day of surgery.  Do not use any recreational drugs for at least a week (preferably 2 weeks) before your surgery.  Please be advised that the combination of cocaine and anesthesia may have negative outcomes, up to and including death. If you test positive for cocaine, your surgery will be cancelled.  On the morning of surgery brush your teeth with toothpaste and water,  you may rinse your mouth with mouthwash if you wish. Do not swallow any toothpaste or mouthwash.  Take a fresh shower, the morning of your procedure.   Do not wear jewelry, make-up, hairpins, clips or nail polish.  For welded (permanent) jewelry: bracelets, anklets, waist bands, etc.  Please have this removed prior to surgery.  If it is not removed, there is a chance that hospital personnel will need to cut it off on the day of surgery.  Do not wear lotions, powders, or perfumes.   Do not shave body hair from the neck down 48 hours before surgery.  Contact lenses, hearing aids and dentures may not be worn into surgery.  Do not bring valuables to the hospital. [facility] is not responsible for any missing/lost belongings or valuables.    Notify your doctor if there is any change in your medical condition (cold, fever, infection).  Wear comfortable clothing (specific to your surgery type) to the hospital.  After surgery, you can help prevent lung complications by doing breathing exercises.  Take deep breaths and cough every 1-2 hours. Your doctor may order a device called an Incentive Spirometer to help you take deep breaths. When coughing or sneezing, hold a pillow firmly against your incision with both hands. This is called "splinting." Doing this helps protect your incision. It also decreases belly discomfort.  If you are being admitted to the hospital overnight, leave your suitcase in the car. After surgery it may be brought to your room.  In case of increased  patient census, it may be necessary for you, the patient, to continue your postoperative care in the Same Day Surgery department.  If you are being discharged the day of surgery, you will not be allowed to drive home. You will need a responsible individual to drive you home and stay with you for 24 hours after surgery.   If you are taking public transportation, you will need to have a responsible individual with  you.  Please call the Pre-admissions Testing Dept. at [phone] if you have any questions about these instructions.  Surgery Visitation Policy:  Patients having surgery or a procedure may have two visitors.  Children under the age of 48 must have an adult with them who is not the patient.  Inpatient Visitation:    Visiting hours are 7 a.m. to 8 p.m. Up to four visitors are allowed at one time in a patient room. The visitors may rotate out with other people during the day.  One visitor age 10 or older may stay with the patient overnight and must be in the room by 8 p.m.   Merchandiser, Retail to address health-related social needs:  [email or web address]

## 2024-01-25 ENCOUNTER — Ambulatory Visit: Payer: Self-pay | Admitting: Certified Registered"

## 2024-01-25 ENCOUNTER — Ambulatory Visit: Admission: RE | Admit: 2024-01-25 | Discharge: 2024-01-25 | Disposition: A | Attending: Urology | Admitting: Urology

## 2024-01-25 ENCOUNTER — Encounter
Admission: RE | Disposition: A | Discharge status: Home / Self Care | Payer: Self-pay | Source: Home / Self Care | Attending: Urology

## 2024-01-25 ENCOUNTER — Ambulatory Visit: Payer: Self-pay | Admitting: Urgent Care

## 2024-01-25 ENCOUNTER — Other Ambulatory Visit: Payer: Self-pay

## 2024-01-25 ENCOUNTER — Encounter: Payer: Self-pay | Admitting: Urology

## 2024-01-25 ENCOUNTER — Ambulatory Visit

## 2024-01-25 DIAGNOSIS — N201 Calculus of ureter: Secondary | ICD-10-CM

## 2024-01-25 HISTORY — PX: CYSTOSCOPY/URETEROSCOPY/HOLMIUM LASER/STENT PLACEMENT: SHX6546

## 2024-01-25 SURGERY — CYSTOSCOPY/URETEROSCOPY/HOLMIUM LASER/STENT PLACEMENT
Anesthesia: General | Site: Ureter | Laterality: Right

## 2024-01-25 MED ORDER — OXYCODONE HCL 5 MG PO TABS
5.0000 mg | ORAL_TABLET | Freq: Once | ORAL | Status: AC | PRN
  Administered 2024-01-25: 5 mg via ORAL

## 2024-01-25 MED ORDER — SODIUM CHLORIDE 0.9 % IR SOLN
Status: DC | PRN
  Administered 2024-01-25: 3000 mL

## 2024-01-25 MED ORDER — ONDANSETRON HCL 4 MG/2ML IJ SOLN
INTRAMUSCULAR | Status: DC | PRN
  Administered 2024-01-25: 4 mg via INTRAVENOUS

## 2024-01-25 MED ORDER — CHLORHEXIDINE GLUCONATE 0.12 % MT SOLN
OROMUCOSAL | Status: AC
  Filled 2024-01-25: qty 15

## 2024-01-25 MED ORDER — ROCURONIUM BROMIDE 100 MG/10ML IV SOLN
INTRAVENOUS | Status: DC | PRN
  Administered 2024-01-25: 40 mg via INTRAVENOUS

## 2024-01-25 MED ORDER — LIDOCAINE HCL (CARDIAC) PF 100 MG/5ML IV SOSY
PREFILLED_SYRINGE | INTRAVENOUS | Status: DC | PRN
  Administered 2024-01-25: 90 mg via INTRAVENOUS

## 2024-01-25 MED ORDER — EPHEDRINE SULFATE (PRESSORS) 25 MG/5ML IV SOSY
PREFILLED_SYRINGE | INTRAVENOUS | Status: DC | PRN
  Administered 2024-01-25: 7.5 mg via INTRAVENOUS
  Administered 2024-01-25: 5 mg via INTRAVENOUS

## 2024-01-25 MED ORDER — OXYBUTYNIN CHLORIDE ER 5 MG PO TB24: 5.0000 mg | ORAL_TABLET | Freq: Every day | ORAL | 0 refills | Status: AC

## 2024-01-25 MED ORDER — ONDANSETRON HCL 4 MG/2ML IJ SOLN
INTRAMUSCULAR | Status: AC
  Filled 2024-01-25: qty 2

## 2024-01-25 MED ORDER — OXYCODONE HCL 5 MG PO TABS
ORAL_TABLET | ORAL | Status: AC
  Filled 2024-01-25: qty 1

## 2024-01-25 MED ORDER — CEFAZOLIN SODIUM-DEXTROSE 2-4 GM/100ML-% IV SOLN
INTRAVENOUS | Status: AC
  Filled 2024-01-25: qty 100

## 2024-01-25 MED ORDER — OXYCODONE HCL 5 MG/5ML PO SOLN: 5.0000 mg | Freq: Once | ORAL | Status: AC | PRN

## 2024-01-25 MED ORDER — PROPOFOL 10 MG/ML IV BOLUS
INTRAVENOUS | Status: DC | PRN
  Administered 2024-01-25: 160 mg via INTRAVENOUS

## 2024-01-25 MED ORDER — PHENYLEPHRINE 80 MCG/ML (10ML) SYRINGE FOR IV PUSH (FOR BLOOD PRESSURE SUPPORT)
PREFILLED_SYRINGE | INTRAVENOUS | Status: DC | PRN
  Administered 2024-01-25: 120 ug via INTRAVENOUS
  Administered 2024-01-25: 80 ug via INTRAVENOUS

## 2024-01-25 MED ORDER — SUCCINYLCHOLINE CHLORIDE 200 MG/10ML IV SOSY
PREFILLED_SYRINGE | INTRAVENOUS | Status: DC | PRN
  Administered 2024-01-25: 120 mg via INTRAVENOUS

## 2024-01-25 MED ORDER — MIDAZOLAM HCL (PF) 2 MG/2ML IJ SOLN
INTRAMUSCULAR | Status: DC | PRN
  Administered 2024-01-25: 2 mg via INTRAVENOUS

## 2024-01-25 MED ORDER — STERILE WATER FOR IRRIGATION IR SOLN
Status: DC | PRN
  Administered 2024-01-25: 500 mL

## 2024-01-25 MED ORDER — IOHEXOL 180 MG/ML  SOLN
INTRAMUSCULAR | Status: DC | PRN
  Administered 2024-01-25: 10 mL

## 2024-01-25 MED ORDER — TRAMADOL HCL 50 MG PO TABS: 50.0000 mg | ORAL_TABLET | Freq: Four times a day (QID) | ORAL | 0 refills | Status: AC | PRN

## 2024-01-25 MED ORDER — PHENAZOPYRIDINE HCL 200 MG PO TABS: 200.0000 mg | ORAL_TABLET | Freq: Three times a day (TID) | ORAL | 0 refills | Status: AC | PRN

## 2024-01-25 MED ORDER — FENTANYL CITRATE (PF) 100 MCG/2ML IJ SOLN
INTRAMUSCULAR | Status: DC | PRN
  Administered 2024-01-25: 50 ug via INTRAVENOUS

## 2024-01-25 MED ORDER — FENTANYL CITRATE (PF) 100 MCG/2ML IJ SOLN: 25.0000 ug | INTRAMUSCULAR | Status: DC | PRN

## 2024-01-25 MED ORDER — DEXAMETHASONE SOD PHOSPHATE PF 10 MG/ML IJ SOLN
INTRAMUSCULAR | Status: DC | PRN
  Administered 2024-01-25: 5 mg via INTRAVENOUS

## 2024-01-25 MED ORDER — TAMSULOSIN HCL 0.4 MG PO CAPS: 0.4000 mg | ORAL_CAPSULE | Freq: Every day | ORAL | 0 refills | Status: AC

## 2024-01-25 MED ORDER — MIDAZOLAM HCL 2 MG/2ML IJ SOLN
INTRAMUSCULAR | Status: AC
  Filled 2024-01-25: qty 2

## 2024-01-25 MED ORDER — ROCURONIUM BROMIDE 10 MG/ML (PF) SYRINGE
PREFILLED_SYRINGE | INTRAVENOUS | Status: AC
  Filled 2024-01-25: qty 10

## 2024-01-25 MED ORDER — SUGAMMADEX SODIUM 200 MG/2ML IV SOLN
INTRAVENOUS | Status: DC | PRN
  Administered 2024-01-25: 400 mg via INTRAVENOUS

## 2024-01-25 MED ORDER — CIPROFLOXACIN HCL 500 MG PO TABS: ORAL_TABLET | ORAL | 0 refills | Status: DC

## 2024-01-25 MED ORDER — LIDOCAINE HCL (PF) 2 % IJ SOLN
INTRAMUSCULAR | Status: AC
  Filled 2024-01-25: qty 5

## 2024-01-25 MED ORDER — FENTANYL CITRATE (PF) 100 MCG/2ML IJ SOLN
INTRAMUSCULAR | Status: AC
  Filled 2024-01-25: qty 2

## 2024-01-25 MED ORDER — PROPOFOL 10 MG/ML IV BOLUS
INTRAVENOUS | Status: AC
  Filled 2024-01-25: qty 20

## 2024-01-25 SURGICAL SUPPLY — 25 items
ADHESIVE MASTISOL STRL (MISCELLANEOUS) IMPLANT
BAG DRAIN SIEMENS DORNER NS (MISCELLANEOUS) ×1 IMPLANT
BAG PRESSURE INF REUSE 3000 (BAG) ×1 IMPLANT
BASKET ZERO TIP 1.9FR (BASKET) IMPLANT
BRUSH SCRUB EZ 4% CHG (MISCELLANEOUS) ×1 IMPLANT
CATH URET FLEX-TIP 2 LUMEN 10F (CATHETERS) IMPLANT
CATH URETL OPEN 5X70 (CATHETERS) IMPLANT
DRAPE UTILITY 15X26 TOWEL STRL (DRAPES) ×1 IMPLANT
DRSG TEGADERM 2-3/8X2-3/4 SM (GAUZE/BANDAGES/DRESSINGS) IMPLANT
FIBER LASER MOSES 200 DFL (Laser) IMPLANT
GLOVE BIOGEL PI IND STRL 7.0 (GLOVE) ×1 IMPLANT
GOWN STRL REUS W/ TWL LRG LVL3 (GOWN DISPOSABLE) ×2 IMPLANT
GUIDEWIRE STR DUAL SENSOR (WIRE) ×1 IMPLANT
KIT TURNOVER CYSTO (KITS) ×1 IMPLANT
PACK CYSTO AR (MISCELLANEOUS) ×1 IMPLANT
SET CYSTO IRRIGATION (SET/KITS/TRAYS/PACK) ×1 IMPLANT
SHEATH NAVIGATOR HD 12/14X36 (SHEATH) IMPLANT
SOL .9 NS 3000ML IRR UROMATIC (IV SOLUTION) ×1 IMPLANT
SOLN STERILE WATER 500 ML (IV SOLUTION) ×1 IMPLANT
STENT URET 6FRX24 CONTOUR (STENTS) IMPLANT
STENT URET 6FRX26 CONTOUR (STENTS) IMPLANT
SURGILUBE 2OZ TUBE FLIPTOP (MISCELLANEOUS) ×1 IMPLANT
SYR 10ML LL (SYRINGE) ×1 IMPLANT
TUBING THERMACLEAR UROLOGY (TUBING) IMPLANT
VALVE UROSEAL ADJ ENDO (VALVE) IMPLANT

## 2024-01-25 NOTE — Anesthesia Procedure Notes (Addendum)
 Procedure Name: Intubation Date/Time: 01/25/2024 8:52 AM  Performed by: Duwayne Craven, CRNAPre-anesthesia Checklist: Patient identified, Patient being monitored, Timeout performed, Emergency Drugs available and Suction available Patient Re-evaluated:Patient Re-evaluated prior to induction Oxygen Delivery Method: Circle system utilized Preoxygenation: Pre-oxygenation with 100% oxygen Induction Type: IV induction Ventilation: Oral airway inserted - appropriate to patient size and Mask ventilation without difficulty Laryngoscope Size: 3 and Glidescope Grade View: Grade I Tube type: Oral Tube size: 7.0 mm Number of attempts: 1 Airway Equipment and Method: Stylet and Video-laryngoscopy Placement Confirmation: ETT inserted through vocal cords under direct vision, positive ETCO2 and breath sounds checked- equal and bilateral Secured at: 20 cm Tube secured with: Tape Dental Injury: Teeth and Oropharynx as per pre-operative assessment

## 2024-01-25 NOTE — Interval H&P Note (Signed)
 History and Physical Interval Note:  01/25/2024 9:26 AM  Kristen Bush  has presented today for surgery, with the diagnosis of Right Ureteral Stone.  The various methods of treatment have been discussed with the patient and family. After consideration of risks, benefits and other options for treatment, the patient has consented to  Procedure(s): CYSTOSCOPY/URETEROSCOPY/HOLMIUM LASER/STENT PLACEMENT (Right) as a surgical intervention.  The patient's history has been reviewed, patient examined, no change in status, stable for surgery.  I have reviewed the patient's chart and labs.  Questions were answered to the patient's satisfaction.    General: no acute distress, alert/oriented, conversational  HEENT: equal nondilated pupils CV: regular rate Lung: unlabored breathing, regular rate and rhythm  Abd: nondistended, nontender with palpation, no palpable masses  MSK: moving all extremities without issue, normal observed motor function   Kristen Bush

## 2024-01-25 NOTE — Discharge Instructions (Addendum)
 DISCHARGE INSTRUCTIONS FOR KIDNEY STONE/URETERAL STENT   MEDICATIONS:  1.  Resume all your other meds from home - except do not take any extra narcotic pain meds that you may have at home.  2. Pyridium  is to help with the burning/stinging when you urinate. 3. Tramadol is for moderate/severe pain, otherwise taking upto 1000 mg every 6 hours of plainTylenol will help treat your pain.   4. Take Cipro one hour prior to removal of your stent.   ACTIVITY:  1. No strenuous activity x 1week  2. No driving while on narcotic pain medications  3. Drink plenty of water  4. Continue to walk at home - you can still get blood clots when you are at home, so keep active, but don't over do it.  5. May return to work/school tomorrow or when you feel ready   BATHING:  1. You can shower and we recommend daily showers  2. You have a string coming from your urethra: The stent string is attached to your ureteral stent. Do not pull on this.   SIGNS/SYMPTOMS TO CALL:  Please call us  if you have a fever greater than 101.5, uncontrolled nausea/vomiting, uncontrolled pain, dizziness, unable to urinate, bloody urine, chest pain, shortness of breath, leg swelling, leg pain, redness around wound, drainage from wound, or any other concerns or questions.   You can reach us  at [phone].   FOLLOW-UP:  You have an indwelling Right ureteral stent (completely on the inside) that will remain in place for 2 weeks. You will be scheduled for an appointment in the Urology clinic for a cystoscopy and ureteral stent removal in 2 weeks.  2. You will have an appointment in 8 weeks with a ultrasound of your kidneys prior.

## 2024-01-25 NOTE — Transfer of Care (Signed)
 Immediate Anesthesia Transfer of Care Note  Patient: Kristen Bush  Procedure(s) Performed: CYSTOSCOPY/URETEROSCOPY/HOLMIUM LASER/STENT PLACEMENT (Right: Ureter)  Patient Location: PACU  Anesthesia Type:General  Level of Consciousness: drowsy and patient cooperative  Airway & Oxygen Therapy: Patient Spontanous Breathing and Patient connected to face mask oxygen  Post-op Assessment: Report given to RN, Post -op Vital signs reviewed and stable, and Patient moving all extremities  Post vital signs: Reviewed and stable  Last Vitals:  Vitals Value Taken Time  BP 130/75 01/25/24 10:39  Temp    Pulse 71 01/25/24 10:41  Resp    SpO2 100 % 01/25/24 10:41  Vitals shown include unfiled device data.  Last Pain:  Vitals:   01/25/24 0827  TempSrc: Temporal  PainSc: 7          Complications: No notable events documented.

## 2024-01-25 NOTE — Anesthesia Postprocedure Evaluation (Signed)
 Anesthesia Post Note  Patient: Kristen Bush  Procedure(s) Performed: CYSTOSCOPY/URETEROSCOPY/HOLMIUM LASER/STENT PLACEMENT (Right: Ureter)  Patient location during evaluation: PACU Anesthesia Type: General Level of consciousness: awake and alert Pain management: pain level controlled Vital Signs Assessment: post-procedure vital signs reviewed and stable Respiratory status: spontaneous breathing, nonlabored ventilation, respiratory function stable and patient connected to nasal cannula oxygen Cardiovascular status: blood pressure returned to baseline and stable Postop Assessment: no apparent nausea or vomiting Anesthetic complications: no   No notable events documented.   Last Vitals:  Vitals:   01/25/24 1100 01/25/24 1136  BP: 126/68 (!) 140/86  Pulse: 64 60  Resp: 11 14  Temp: (!) 35.9 C 36.7 C  SpO2: 99% 99%    Last Pain:  Vitals:   01/25/24 1136  TempSrc: Temporal  PainSc: 3                  Debby Mines

## 2024-01-25 NOTE — Op Note (Signed)
 Preoperative diagnosis:  Right ureteral stone   Postoperative diagnosis:  Right ureteral stone   Procedure:  Cystoscopy Right ureteroscopy with laser lithotripsy Right retrograde pyelography with interpretation  Right ureteral stent placement  Surgeon: Penne Skye, MD  Anesthesia: General  Complications: None  Intraoperative findings: Migration of Right ureteral stone to UVJ with significant impaction Successful laser lithotripsy of stone and complete basket extraction Ureteral mucosal thinning at location of impaction, with very mild extravasation with final RPG- ureteral stent without string placed for this reason   EBL: Minimal  Specimens: Right ureteral stone  Indication: Kristen Bush is a 48 y.o. patient with:  5mm Right proximal ureteral stone (dx 01/11/24, by outside CT report)   - KUB (01/13/24) - 5mm Right mid ureteral calcification   After reviewing the management options for treatment, he elected to proceed with the above surgical procedure(s). We have discussed the potential benefits and risks of the procedure, side effects of the proposed treatment, the likelihood of the patient achieving the goals of the procedure, and any potential problems that might occur during the procedure or recuperation. Informed consent has been obtained.  Description of procedure:  The patient was taken to the operating room and general anesthesia was induced.  The patient was placed in the dorsal lithotomy position, prepped and draped in the usual sterile fashion, and preoperative antibiotics were administered. A preoperative time-out was performed.   Cystourethroscopy was performed.  The patient's urethra was examined and was normal. The bladder was then systematically examined in its entirety. There was no evidence for any bladder tumors, stones, or other mucosal pathology.  Bilateral ureteral orifices noted in orthotopic location.  Attention then turned to the Right ureteral  orifice and a ureteral catheter was used to intubate the ureteral orifice.   A sensor guidewire was then advanced up the Right ureter into the renal pelvis under fluoroscopic guidance. We transitioned to a semi-rigid ureteroscope and a complete pyeloscopy was performed.  Stone burden was encountered at the UVJ, which had migrated from prior mid to proximal ureter on pre-op imaging. The stone was quite impacted within the UVJ.  A 200 m holmium laser was used to fully treat the stone with laser lithotripsy.   Next, a zero-tip wire basket was introduced and all sizeable stone fragments were extracted. Following lithotripsy, we reinspected the distal ureter which appeared to have mucosal thinning at the site of impaction and treatment. A distal RPG was obtained which suggested a whiff of contrast extravasation at this location, the remainder of mid and proximal ureter showed normal contours, mild proximal hydroureteronephrosis.  We elected to place a temporary indwelling stent without a string. Our sensor guidewire was replaced and a 26F x 24 cm JJ stent , which was placed via fluoroscopic guidance. A spot KUB confirmed appropriate proximal and distal curl locations.   The bladder was then emptied and the procedure ended.  The patient appeared to tolerate the procedure well and without complications.  The patient was able to be awakened and transferred to the recovery unit in satisfactory condition.   Plan: - return to clinic in 2 weeks for cystoscopic stent removal - Plan for RBUS in 8 weeks with clinical follow up  Penne Skye, M.D.

## 2024-01-25 NOTE — Anesthesia Preprocedure Evaluation (Signed)
 Anesthesia Evaluation  Patient identified by MRN, date of birth, ID band Patient awake    Reviewed: Allergy & Precautions, NPO status , Patient's Chart, lab work & pertinent test results  History of Anesthesia Complications Negative for: history of anesthetic complications  Airway Mallampati: II  TM Distance: >3 FB Neck ROM: Full    Dental no notable dental hx.    Pulmonary neg pulmonary ROS, neg sleep apnea, neg COPD   breath sounds clear to auscultation- rhonchi (-) wheezing      Cardiovascular Exercise Tolerance: Good hypertension, Pt. on medications (-) CAD, (-) Past MI, (-) Cardiac Stents and (-) CABG  Rhythm:Regular Rate:Normal - Systolic murmurs and - Diastolic murmurs    Neuro/Psych neg Seizures PSYCHIATRIC DISORDERS  Depression    negative neurological ROS     GI/Hepatic negative GI ROS, Neg liver ROS,,,  Endo/Other  negative endocrine ROSneg diabetes    Renal/GU      Musculoskeletal   Abdominal   Peds  Hematology negative hematology ROS (+)   Anesthesia Other Findings Past Medical History: No date: Hypertension No date: Situational depression   Reproductive/Obstetrics                              Anesthesia Physical Anesthesia Plan  ASA: 2  Anesthesia Plan: General ETT   Post-op Pain Management:    Induction: Intravenous  PONV Risk Score and Plan: 3 and Ondansetron , Dexamethasone  and Midazolam   Airway Management Planned: Oral ETT  Additional Equipment:   Intra-op Plan:   Post-operative Plan: Extubation in OR  Informed Consent: I have reviewed the patients History and Physical, chart, labs and discussed the procedure including the risks, benefits and alternatives for the proposed anesthesia with the patient or authorized representative who has indicated his/her understanding and acceptance.     Dental Advisory Given  Plan Discussed with: Anesthesiologist,  CRNA and Surgeon  Anesthesia Plan Comments: (Patient consented for risks of anesthesia including but not limited to:  - adverse reactions to medications - damage to eyes, teeth, lips or other oral mucosa - nerve damage due to positioning  - sore throat or hoarseness - Damage to heart, brain, nerves, lungs, other parts of body or loss of life  Patient voiced understanding and assent.)        Anesthesia Quick Evaluation

## 2024-01-26 ENCOUNTER — Encounter: Payer: Self-pay | Admitting: Urology

## 2024-02-01 ENCOUNTER — Other Ambulatory Visit: Payer: Self-pay

## 2024-02-01 ENCOUNTER — Telehealth: Payer: Self-pay

## 2024-02-01 DIAGNOSIS — N201 Calculus of ureter: Secondary | ICD-10-CM

## 2024-02-01 MED ORDER — PHENAZOPYRIDINE HCL 100 MG PO TABS: 100.0000 mg | ORAL_TABLET | Freq: Three times a day (TID) | ORAL | 0 refills | Status: DC | PRN

## 2024-02-01 NOTE — Telephone Encounter (Signed)
 Pt is requesting Toradol  refill. Tylenol  is not helping with her break through pain. She has issues taking some many pills due to a Hx of gastric bypass. Pt states some blood in urine, bladder spasms and some frequency and urgency. I explained thses are normal for stents and for her to continue taking the Oxybutyin, Flomax  and Pyridium  which I sent a refill in for. Please advise on the toradol  please?

## 2024-02-02 ENCOUNTER — Other Ambulatory Visit: Payer: Self-pay | Admitting: Urology

## 2024-02-02 MED ORDER — OXYBUTYNIN CHLORIDE ER 10 MG PO TB24: 10.0000 mg | ORAL_TABLET | Freq: Every day | ORAL | 1 refills | Status: AC

## 2024-02-02 NOTE — Progress Notes (Signed)
 Sending in Ditropan  for stent related discomfort and OAB with UUI

## 2024-02-03 LAB — STONE ANALYSIS
Calcium Oxalate Dihydrate: 20 %
Calcium Oxalate Monohydrate: 80 %
Weight Calculi: 13 mg

## 2024-02-04 NOTE — Progress Notes (Signed)
° °  02/09/2024 9:16 AM   Dwayne DELENA Sheen 30-Jun-1975 [id number]  Cystoscopy Procedure Note:  Indication:  S/p Right URS/LL for 5mm distal stone  - significant impaction w/ mild contrast extrav at treatment site  - s/p 2-week indwelling ureteral stent  After informed consent and discussion of the procedure and its risks, SENETRA DILLIN was positioned and prepped in the standard fashion. Cystoscopy was performed with a flexible cystoscope. The external vaginal anatomy, urethra, bladder neck and bladder mucosa were visualized in a systematic fashion. The ureteral orifices were noted in orthotopic location and orientation. There were no bladder mucosal lesions, stones, debris or anatomic variants noted.   The right ureteral stent was controlled with flexible graspers and fully removed without issue, minimal encrustation of proximal distal curls.  Imaging: N/A  Findings: Successful right ureteral stent removal  Assessment and Plan: - schedule RBUS in ~6-8 weeks with return visit. Will review medical management of kidney stone disease  Penne Skye, MD 02/04/2024

## 2024-02-09 ENCOUNTER — Encounter: Payer: Self-pay | Admitting: Urology

## 2024-02-09 ENCOUNTER — Ambulatory Visit: Admitting: Urology

## 2024-02-09 VITALS — BP 128/84 | HR 74 | Ht 64.0 in | Wt 184.0 lb

## 2024-02-09 DIAGNOSIS — Z466 Encounter for fitting and adjustment of urinary device: Secondary | ICD-10-CM | POA: Diagnosis not present

## 2024-02-09 DIAGNOSIS — N201 Calculus of ureter: Secondary | ICD-10-CM

## 2024-02-10 ENCOUNTER — Telehealth: Payer: Self-pay

## 2024-02-10 NOTE — Telephone Encounter (Signed)
 Called pt did not receive answer. Wanted to let patient know that she needs to get RUS done in about 2-3 weeks and give her the Central scheduling number in case they didn't reach out to her #[phone]. I also wanted to let her know 1 month follow up appointment  has been scheduled for 03/16/2024 @10 :00am. Left voice mail for her to give us  a call so I could explain further. No further comments.-Jazzlyn Huizenga,CMA.

## 2024-02-10 NOTE — Addendum Note (Signed)
 Addended by: Emeline Simpson E on: 02/10/2024 10:46 AM   Modules accepted: Orders

## 2024-02-25 ENCOUNTER — Other Ambulatory Visit: Payer: Self-pay | Admitting: Urology

## 2024-03-07 ENCOUNTER — Ambulatory Visit
Admission: RE | Admit: 2024-03-07 | Discharge: 2024-03-07 | Disposition: A | Discharge status: Home / Self Care | Source: Ambulatory Visit | Attending: Urology | Admitting: Urology

## 2024-03-07 DIAGNOSIS — N201 Calculus of ureter: Secondary | ICD-10-CM | POA: Insufficient documentation

## 2024-03-09 NOTE — Assessment & Plan Note (Addendum)
 S/p Right URS/LL for 5mm distal stone (01/25/24)  - significant impaction w/ mild contrast extrav at treatment site  - s/p 2-week indwelling ureteral stent  History of prior gastric bypass  RBUS (03/07/24) - healthy Right kidney, no hydro. Small 5mm LLP nonobstructive stone Stone comp: CaOX 80% mono, 20% di  Today we reviewed the basic tenets of kidney stone management and prevention. We reviewed five broad recommendations:   - Adequate fluid intake >=2.5 L/day (goal urine output >=2 L/day). - Maintenance of normal dietary calcium (1,000-1,200 mg/day), avoid excess calcium supplements - Reduction of sodium intake (<2 g/day) to lower urinary calcium excretion. - Moderation of oxalate-rich foods (nuts, spinach, beets, chocolate, tea)  - Limit animal protein (meat, fish, poultry); promote balanced diet with fruits/vegetables.   -RTC in 1 year with RBUS prior, sooner if new symptoms arise

## 2024-03-09 NOTE — Progress Notes (Signed)
" ° °  03/16/2024 10:37 AM   Kristen Bush 1975-09-17 [id number]  Reason for visit: Follow up nephrolithiasis   HPI: 49 y.o. female, follow up with me today  Overall doing well Thought she had a UTI last week -although negative culture, she showed me on her phone  ~6 week post op RBUS (03/07/24) - healthy Right kidney, no hydro. Small 5mm LLP nonobstructive stone Stone comp: CaOX 80% mono, 20% di  Prior HPI: S/p Right URS/LL for 5mm distal stone (01/25/24)  - significant impaction w/ mild contrast extrav at treatment site  - s/p 2-week indwelling ureteral stent   History of prior gastric bypass   Physical Exam: BP 129/88   Pulse 84   Ht 5' 4 (1.626 m)   Wt 173 lb (78.5 kg)   BMI 29.70 kg/m    Constitutional:  Alert and oriented, No acute distress.  Laboratory Data: Calcium Oxalate Monohydrate 80 VC  Calcium Oxalate Dihydrate 20 VC  Calculus Received Wet Comment VC    Pertinent Imaging: I have personally viewed and interpreted the RBUS (03/07/24) -  IMPRESSION: 1. 5 mm nonobstructive stone at the lower pole of the left kidney. 2. Normal right kidney.  No right-sided nephrolithiasis. 3. No hydronephrosis..    Assessment & Plan:    Nephrolithiasis Assessment & Plan: S/p Right URS/LL for 5mm distal stone (01/25/24)  - significant impaction w/ mild contrast extrav at treatment site  - s/p 2-week indwelling ureteral stent  History of prior gastric bypass  RBUS (03/07/24) - healthy Right kidney, no hydro. Small 5mm LLP nonobstructive stone Stone comp: CaOX 80% mono, 20% di  Today we reviewed the basic tenets of kidney stone management and prevention. We reviewed five broad recommendations:   - Adequate fluid intake >=2.5 L/day (goal urine output >=2 L/day). - Maintenance of normal dietary calcium (1,000-1,200 mg/day), avoid excess calcium supplements - Reduction of sodium intake (<2 g/day) to lower urinary calcium excretion. - Moderation of oxalate-rich foods  (nuts, spinach, beets, chocolate, tea)  - Limit animal protein (meat, fish, poultry); promote balanced diet with fruits/vegetables.   -RTC in 1 year with RBUS prior, sooner if new symptoms arise  Orders: -     US  RENAL; Future       Kristen JONELLE Skye, MD  [facility] [address] [place], [place] [address] [phone] "

## 2024-03-14 ENCOUNTER — Other Ambulatory Visit: Payer: Self-pay | Admitting: Urology

## 2024-03-14 ENCOUNTER — Encounter

## 2024-03-14 DIAGNOSIS — N201 Calculus of ureter: Secondary | ICD-10-CM

## 2024-03-16 ENCOUNTER — Ambulatory Visit (INDEPENDENT_AMBULATORY_CARE_PROVIDER_SITE_OTHER): Admitting: Urology

## 2024-03-16 ENCOUNTER — Encounter: Payer: Self-pay | Admitting: Urology

## 2024-03-16 DIAGNOSIS — N200 Calculus of kidney: Secondary | ICD-10-CM

## 2024-03-16 NOTE — Patient Instructions (Signed)
 Please call [phone] to schedule your imaging prior to your appointment. Please allow time for your imaging results as this can take up 2-3 weeks to review. A follow up appointment as already been scheduled for the doctor to review results with you.

## 2024-03-22 ENCOUNTER — Ambulatory Visit

## 2025-03-16 ENCOUNTER — Ambulatory Visit: Admitting: Urology
